# Patient Record
Sex: Male | Born: 1940 | Race: White | Hispanic: No | Marital: Single | Smoking: Current every day smoker
Health system: Southern US, Community
[De-identification: ages and names within clinical notes are randomized; demographics above are authoritative.]

## PROBLEM LIST (undated history)

## (undated) DIAGNOSIS — Z9289 Personal history of other medical treatment: Secondary | ICD-10-CM

## (undated) DIAGNOSIS — E785 Hyperlipidemia, unspecified: Secondary | ICD-10-CM

## (undated) DIAGNOSIS — I255 Ischemic cardiomyopathy: Secondary | ICD-10-CM

## (undated) DIAGNOSIS — I10 Essential (primary) hypertension: Secondary | ICD-10-CM

## (undated) DIAGNOSIS — I2510 Atherosclerotic heart disease of native coronary artery without angina pectoris: Secondary | ICD-10-CM

## (undated) DIAGNOSIS — E119 Type 2 diabetes mellitus without complications: Secondary | ICD-10-CM

## (undated) DIAGNOSIS — Z720 Tobacco use: Secondary | ICD-10-CM

## (undated) HISTORY — DX: Ischemic cardiomyopathy: I25.5

## (undated) HISTORY — PX: BACK SURGERY: SHX140

## (undated) HISTORY — DX: Atherosclerotic heart disease of native coronary artery without angina pectoris: I25.10

## (undated) HISTORY — PX: NECK SURGERY: SHX720

## (undated) HISTORY — DX: Personal history of other medical treatment: Z92.89

## (undated) HISTORY — PX: HERNIA REPAIR: SHX51

## (undated) HISTORY — DX: Tobacco use: Z72.0

## (undated) HISTORY — DX: Hyperlipidemia, unspecified: E78.5

---

## 2013-08-08 ENCOUNTER — Inpatient Hospital Stay (HOSPITAL_COMMUNITY)
Admission: EM | Admit: 2013-08-08 | Discharge: 2013-08-10 | DRG: 247 | Disposition: A | Discharge status: Home / Self Care | Payer: Medicare Other | Attending: Cardiovascular Disease | Admitting: Cardiovascular Disease

## 2013-08-08 ENCOUNTER — Emergency Department (HOSPITAL_COMMUNITY): Payer: Medicare Other

## 2013-08-08 ENCOUNTER — Encounter (HOSPITAL_COMMUNITY): Payer: Self-pay | Admitting: Emergency Medicine

## 2013-08-08 ENCOUNTER — Encounter (HOSPITAL_COMMUNITY)
Admission: EM | Disposition: A | Discharge status: Home / Self Care | Payer: Medicare Other | Source: Home / Self Care | Attending: Cardiovascular Disease

## 2013-08-08 DIAGNOSIS — J449 Chronic obstructive pulmonary disease, unspecified: Secondary | ICD-10-CM

## 2013-08-08 DIAGNOSIS — E7800 Pure hypercholesterolemia, unspecified: Secondary | ICD-10-CM | POA: Diagnosis present

## 2013-08-08 DIAGNOSIS — I517 Cardiomegaly: Secondary | ICD-10-CM

## 2013-08-08 DIAGNOSIS — E785 Hyperlipidemia, unspecified: Secondary | ICD-10-CM

## 2013-08-08 DIAGNOSIS — F17200 Nicotine dependence, unspecified, uncomplicated: Secondary | ICD-10-CM | POA: Diagnosis present

## 2013-08-08 DIAGNOSIS — I2510 Atherosclerotic heart disease of native coronary artery without angina pectoris: Secondary | ICD-10-CM | POA: Diagnosis present

## 2013-08-08 DIAGNOSIS — E119 Type 2 diabetes mellitus without complications: Secondary | ICD-10-CM | POA: Diagnosis present

## 2013-08-08 DIAGNOSIS — J4489 Other specified chronic obstructive pulmonary disease: Secondary | ICD-10-CM | POA: Diagnosis present

## 2013-08-08 DIAGNOSIS — I7389 Other specified peripheral vascular diseases: Secondary | ICD-10-CM | POA: Diagnosis present

## 2013-08-08 DIAGNOSIS — I509 Heart failure, unspecified: Secondary | ICD-10-CM | POA: Diagnosis present

## 2013-08-08 DIAGNOSIS — R059 Cough, unspecified: Secondary | ICD-10-CM | POA: Diagnosis present

## 2013-08-08 DIAGNOSIS — I2589 Other forms of chronic ischemic heart disease: Secondary | ICD-10-CM | POA: Diagnosis present

## 2013-08-08 DIAGNOSIS — I10 Essential (primary) hypertension: Secondary | ICD-10-CM | POA: Diagnosis present

## 2013-08-08 DIAGNOSIS — R05 Cough: Secondary | ICD-10-CM | POA: Diagnosis present

## 2013-08-08 DIAGNOSIS — I2119 ST elevation (STEMI) myocardial infarction involving other coronary artery of inferior wall: Secondary | ICD-10-CM

## 2013-08-08 HISTORY — DX: Essential (primary) hypertension: I10

## 2013-08-08 HISTORY — DX: Type 2 diabetes mellitus without complications: E11.9

## 2013-08-08 HISTORY — PX: PERCUTANEOUS CORONARY STENT INTERVENTION (PCI-S): SHX5485

## 2013-08-08 HISTORY — PX: LEFT HEART CATH: SHX5478

## 2013-08-08 LAB — TSH: TSH: 0.15 u[IU]/mL — AB (ref 0.350–4.500)

## 2013-08-08 LAB — POCT I-STAT TROPONIN I: TROPONIN I, POC: 0.15 ng/mL — AB (ref 0.00–0.08)

## 2013-08-08 LAB — BASIC METABOLIC PANEL
BUN: 16 mg/dL (ref 6–23)
BUN: 17 mg/dL (ref 6–23)
CALCIUM: 10.3 mg/dL (ref 8.4–10.5)
CO2: 25 mEq/L (ref 19–32)
CO2: 22 meq/L (ref 19–32)
Calcium: 9.7 mg/dL (ref 8.4–10.5)
Chloride: 99 mEq/L (ref 96–112)
Chloride: 103 mEq/L (ref 96–112)
Creatinine, Ser: 0.75 mg/dL (ref 0.50–1.35)
Creatinine, Ser: 0.67 mg/dL (ref 0.50–1.35)
GFR calc Af Amer: >90 mL/min (ref >90)
GFR calc non Af Amer: 88 mL/min — ABNORMAL LOW (ref >90)
GFR calc non Af Amer: >90 mL/min (ref >90)
Glucose, Bld: 242 mg/dL — ABNORMAL HIGH (ref 70–99)
Glucose, Bld: 213 mg/dL — ABNORMAL HIGH (ref 70–99)
POTASSIUM: 4.7 meq/L (ref 3.7–5.3)
Potassium: 4.2 mEq/L (ref 3.7–5.3)
SODIUM: 140 meq/L (ref 137–147)
Sodium: 139 mEq/L (ref 137–147)

## 2013-08-08 LAB — CBC
HCT: 44.5 % (ref 39.0–52.0)
HEMATOCRIT: 40.7 % (ref 39.0–52.0)
HEMOGLOBIN: 14.5 g/dL (ref 13.0–17.0)
Hemoglobin: 15.9 g/dL (ref 13.0–17.0)
MCH: 34.3 pg — AB (ref 26.0–34.0)
MCH: 34.2 pg — ABNORMAL HIGH (ref 26.0–34.0)
MCHC: 35.7 g/dL (ref 30.0–36.0)
MCHC: 35.6 g/dL (ref 30.0–36.0)
MCV: 96.1 fL (ref 78.0–100.0)
MCV: 96 fL (ref 78.0–100.0)
PLATELETS: 223 10*3/uL (ref 150–400)
Platelets: 214 10*3/uL (ref 150–400)
RBC: 4.63 MIL/uL (ref 4.22–5.81)
RBC: 4.24 MIL/uL (ref 4.22–5.81)
RDW: 12.5 % (ref 11.5–15.5)
RDW: 12.5 % (ref 11.5–15.5)
WBC: 8.2 10*3/uL (ref 4.0–10.5)
WBC: 7 10*3/uL (ref 4.0–10.5)

## 2013-08-08 LAB — GLUCOSE, CAPILLARY
GLUCOSE-CAPILLARY: 205 mg/dL — AB (ref 70–99)
GLUCOSE-CAPILLARY: 233 mg/dL — AB (ref 70–99)
GLUCOSE-CAPILLARY: 273 mg/dL — AB (ref 70–99)

## 2013-08-08 LAB — POCT ACTIVATED CLOTTING TIME: Activated Clotting Time: 553 seconds

## 2013-08-08 LAB — PROTIME-INR
INR: 1.54 — ABNORMAL HIGH (ref 0.00–1.49)
PROTHROMBIN TIME: 18.1 s — AB (ref 11.6–15.2)

## 2013-08-08 LAB — LIPID PANEL
CHOLESTEROL: 133 mg/dL (ref 0–200)
HDL: 24 mg/dL — AB (ref >39)
LDL CALC: 41 mg/dL (ref 0–99)
TRIGLYCERIDES: 342 mg/dL — AB (ref <150)
Total CHOL/HDL Ratio: 5.5 RATIO
VLDL: 68 mg/dL — ABNORMAL HIGH (ref 0–40)

## 2013-08-08 LAB — MRSA PCR SCREENING: MRSA BY PCR: NEGATIVE

## 2013-08-08 LAB — HEMOGLOBIN A1C
Hgb A1c MFr Bld: 8.2 % — ABNORMAL HIGH (ref <5.7)
Mean Plasma Glucose: 189 mg/dL — ABNORMAL HIGH (ref <117)

## 2013-08-08 LAB — TROPONIN I
TROPONIN I: 11.88 ng/mL — AB (ref <0.30)
TROPONIN I: 19.23 ng/mL — AB (ref <0.30)

## 2013-08-08 LAB — PRO B NATRIURETIC PEPTIDE: Pro B Natriuretic peptide (BNP): 420.4 pg/mL — ABNORMAL HIGH (ref 0–125)

## 2013-08-08 SURGERY — LEFT HEART CATH
Anesthesia: LOCAL

## 2013-08-08 MED ORDER — FENTANYL CITRATE 0.05 MG/ML IJ SOLN
INTRAMUSCULAR | Status: AC
  Filled 2013-08-08: qty 2

## 2013-08-08 MED ORDER — HEPARIN SODIUM (PORCINE) 5000 UNIT/ML IJ SOLN
INTRAMUSCULAR | Status: AC
  Filled 2013-08-08: qty 1

## 2013-08-08 MED ORDER — HEPARIN BOLUS VIA INFUSION
4000.0000 [IU] | Freq: Once | INTRAVENOUS | Status: DC
  Filled 2013-08-08: qty 4000

## 2013-08-08 MED ORDER — NITROGLYCERIN 0.4 MG SL SUBL: 0.4000 mg | SUBLINGUAL_TABLET | SUBLINGUAL | Status: DC | PRN

## 2013-08-08 MED ORDER — NITROGLYCERIN 0.2 MG/ML ON CALL CATH LAB
INTRAVENOUS | Status: AC
  Filled 2013-08-08: qty 1

## 2013-08-08 MED ORDER — ONDANSETRON HCL 4 MG/2ML IJ SOLN: 4.0000 mg | Freq: Four times a day (QID) | INTRAMUSCULAR | Status: DC | PRN

## 2013-08-08 MED ORDER — BIVALIRUDIN 250 MG IV SOLR
INTRAVENOUS | Status: AC
  Filled 2013-08-08: qty 250

## 2013-08-08 MED ORDER — VERAPAMIL HCL 2.5 MG/ML IV SOLN
INTRAVENOUS | Status: AC
  Filled 2013-08-08: qty 2

## 2013-08-08 MED ORDER — ATORVASTATIN CALCIUM 80 MG PO TABS
80.0000 mg | ORAL_TABLET | Freq: Every day | ORAL | Status: DC
  Administered 2013-08-08 – 2013-08-09 (×2): 80 mg via ORAL
  Filled 2013-08-08 (×3): qty 1

## 2013-08-08 MED ORDER — SODIUM CHLORIDE 0.9 % IV SOLN
INTRAVENOUS | Status: AC
  Administered 2013-08-08: 05:00:00 via INTRAVENOUS

## 2013-08-08 MED ORDER — SODIUM CHLORIDE 0.9 % IV SOLN: 0.2500 mg/kg/h | INTRAVENOUS | Status: AC

## 2013-08-08 MED ORDER — METOPROLOL TARTRATE 12.5 MG HALF TABLET
12.5000 mg | ORAL_TABLET | Freq: Two times a day (BID) | ORAL | Status: DC
  Administered 2013-08-08 – 2013-08-10 (×4): 12.5 mg via ORAL
  Filled 2013-08-08 (×6): qty 1

## 2013-08-08 MED ORDER — HEPARIN SODIUM (PORCINE) 5000 UNIT/ML IJ SOLN
4000.0000 [IU] | Freq: Once | INTRAMUSCULAR | Status: AC
  Administered 2013-08-08: 4000 [IU] via INTRAVENOUS

## 2013-08-08 MED ORDER — ACETAMINOPHEN 325 MG PO TABS: 650.0000 mg | ORAL_TABLET | ORAL | Status: DC | PRN

## 2013-08-08 MED ORDER — MIDAZOLAM HCL 2 MG/2ML IJ SOLN
INTRAMUSCULAR | Status: AC
Start: 2013-08-08 — End: 2013-08-08
  Filled 2013-08-08: qty 2

## 2013-08-08 MED ORDER — SODIUM CHLORIDE 0.9 % IV BOLUS (SEPSIS)
1000.0000 mL | Freq: Once | INTRAVENOUS | Status: AC
  Administered 2013-08-08: 1000 mL via INTRAVENOUS

## 2013-08-08 MED ORDER — INSULIN ASPART 100 UNIT/ML ~~LOC~~ SOLN
0.0000 [IU] | Freq: Three times a day (TID) | SUBCUTANEOUS | Status: DC
  Administered 2013-08-08: 3 [IU] via SUBCUTANEOUS
  Administered 2013-08-08: 2 [IU] via SUBCUTANEOUS
  Administered 2013-08-08: 3 [IU] via SUBCUTANEOUS
  Administered 2013-08-09: 2 [IU] via SUBCUTANEOUS
  Administered 2013-08-09: 5 [IU] via SUBCUTANEOUS
  Administered 2013-08-09: 2 [IU] via SUBCUTANEOUS
  Administered 2013-08-10: 3 [IU] via SUBCUTANEOUS
  Administered 2013-08-10: 2 [IU] via SUBCUTANEOUS

## 2013-08-08 MED ORDER — TICAGRELOR 90 MG PO TABS
90.0000 mg | ORAL_TABLET | Freq: Two times a day (BID) | ORAL | Status: DC
  Administered 2013-08-08 – 2013-08-10 (×4): 90 mg via ORAL
  Filled 2013-08-08 (×5): qty 1

## 2013-08-08 MED ORDER — ASPIRIN EC 81 MG PO TBEC
81.0000 mg | DELAYED_RELEASE_TABLET | Freq: Every day | ORAL | Status: DC
  Administered 2013-08-09 – 2013-08-10 (×2): 81 mg via ORAL
  Filled 2013-08-08 (×2): qty 1

## 2013-08-08 MED ORDER — HEPARIN (PORCINE) IN NACL 2-0.9 UNIT/ML-% IJ SOLN
INTRAMUSCULAR | Status: AC
Start: 2013-08-08 — End: 2013-08-08
  Filled 2013-08-08: qty 1000

## 2013-08-08 MED ORDER — HEPARIN (PORCINE) IN NACL 100-0.45 UNIT/ML-% IJ SOLN
1000.0000 [IU]/h | Freq: Once | INTRAMUSCULAR | Status: DC
  Filled 2013-08-08: qty 250

## 2013-08-08 MED ORDER — TICAGRELOR 90 MG PO TABS
ORAL_TABLET | ORAL | Status: AC
  Filled 2013-08-08: qty 2

## 2013-08-08 MED ORDER — LIDOCAINE HCL (PF) 1 % IJ SOLN
INTRAMUSCULAR | Status: AC
  Filled 2013-08-08: qty 30

## 2013-08-08 NOTE — Progress Notes (Addendum)
Subjective:   [name] [name] is a 73 y.o. male with h/o tobacco abuse, HTN, and DM admitted with inferior STEMI early this am.  S/p DES x2  To midRCA. Trop 12.   No further CP. Coughing a lot. C/o sinus drainage.   Left main: No obstructive disease.  Left Anterior Descending Artery: Large caliber vessel that courses to the apex. Diffuse 30% stenosis in the mid LAD. The first diagonal is small in caliber with 30% ostial stenosis.  Circumflex Artery: Large caliber vessel with 30% mid stenosis, small caliber first obtuse marginal branch and large caliber second obtuse marginal branch. There is 20% stenosis at the ostium of the second obtuse marginal branch. No obstructive disease.  Right Coronary Artery: Moderate caliber dominant vessel with mid vessel calcification, 99% sub-total occlusion in the mid vessel. The distal vessel has mild plaque disease. The PDA is small in caliber and has mild diffuse disease.  Left Ventricular Angiogram: LVEF=45%, hypokinesis of the inferior wall at the apex.      Intake/Output Summary (Last 24 hours) at 08/08/13 0828 Last data filed at 08/08/13 0600  Gross per 24 hour  Intake    142 ml  Output    300 ml  Net   -158 ml    Current meds: . [START ON 08/09/2013] aspirin EC  81 mg Oral Daily  . atorvastatin  80 mg Oral q1800  . insulin aspart  0-9 Units Subcutaneous TID WC  . metoprolol tartrate  12.5 mg Oral BID  . Ticagrelor  90 mg Oral BID   Infusions: . sodium chloride 75 mL/hr at 08/08/13 0445     Objective:  Blood pressure 130/72, pulse 84, temperature 97.8 F (36.6 C), temperature source Oral, resp. rate 19, height 5\' 6"  (1.676 m), weight 74.9 kg (165 lb 2 oz), SpO2 98.00%. Weight change:   Physical Exam: General:  Elderly. Coughing fit HEENT: normal Neck: supple. JVP flat . Carotids 2+ bilat; no bruits. No lymphadenopathy or thryomegaly appreciated. Cor: PMI nonpalpable. Distant HS Regular rate & rhythm. No obviousrubs, gallops or  murmurs. Lungs: markedly diminished BS throughout Abdomen: soft, nontender, nondistended. No hepatosplenomegaly. No bruits or masses. Good bowel sounds. Extremities: no cyanosis, clubbing, rash, edema DP pulses nonpalpbale Neuro: alert & orientedx3, cranial nerves grossly intact. moves all 4 extremities w/o difficulty. Affect pleasant  Telemetry: SR  Lab Results: Basic Metabolic Panel:  Recent Labs Lab 08/08/13 0220 08/08/13 0550  NA 140 139  K 4.2 4.7  CL 99 103  CO2 25 22  GLUCOSE 242* 213*  BUN 16 17  CREATININE 0.75 0.67  CALCIUM 10.3 9.7   Liver Function Tests: No results found for this basename: AST, ALT, ALKPHOS, BILITOT, PROT, ALBUMIN,  in the last 168 hours No results found for this basename: LIPASE, AMYLASE,  in the last 168 hours No results found for this basename: AMMONIA,  in the last 168 hours CBC:  Recent Labs Lab 08/08/13 0220 08/08/13 0550  WBC 8.2 7.0  HGB 15.9 14.5  HCT 44.5 40.7  MCV 96.1 96.0  PLT 223 214   Cardiac Enzymes:  Recent Labs Lab 08/08/13 0550  TROPONINI 11.88*   BNP: No components found with this basename: POCBNP,  CBG: No results found for this basename: GLUCAP,  in the last 168 hours Microbiology: No results found for this basename: cult   No results found for this basename: CULT, SDES,  in the last 168 hours  Imaging: Dg Chest Portable 1 View  08/08/2013  CLINICAL DATA:  Chest pain.  EXAM: PORTABLE CHEST - 1 VIEW  COMPARISON:  None.  FINDINGS: The lungs are well-aerated and clear. There is no evidence of focal opacification, pleural effusion or pneumothorax.  The cardiomediastinal silhouette is within normal limits. No acute osseous abnormalities are seen.  IMPRESSION: No acute cardiopulmonary process seen.   Electronically Signed   By: Roanna Raider M.D.   On: 08/08/2013 02:38     ASSESSMENT:  1. Inferior STEMI - s/p DES x2 mid RCA 08/08/13 2. CAD  3. Ischemic CM 4. COPD with ongoing Tobacco abuse (2PPD)  5.  DM 6. HTN   PLAN/DISCUSSION:  He is stable post-MI. May be able to transfer out of CCU later today. Appears to have severe COPD and PAD on exam. Long talk about need to quit smoking but he was resistant even to ecigs - has tried to quit multiple times and failled. May need nicotine patch to prevent withdrawal.   Will continue ASA, Brillinta, statin. Switch b-blocker to bisoprolol. Echo pending.   BP well controlled.   Cover DM2 with SSI whil metformin on hold. Resume ACE.    LOS: 0 days    Arvilla Meres, MD 08/08/2013, 8:28 AM

## 2013-08-08 NOTE — ED Notes (Signed)
Dr. Lavella LemonsManly notified of i stat troponin results by B. Bing PlumeHaynes, EMT

## 2013-08-08 NOTE — Progress Notes (Signed)
CARDIAC REHAB PHASE I  Pt with very recent MI, will allow to get to recliner and up in room this evening, will ambulate tomorrow. Discussed MI/stent with pt and family. Broached smoking cessation although pt is not very interested. Also gave diet sheet for pt to read. Pt sts he does try to watch his diet somewhat. Risa GrillJokingly sts he is not interested in exercise due to arthritis. Sts he takes multiple Bayer aspirins daily for arthritic pain. Will f/u in am for more education and ambulation. [id number]  Elissa LovettReeve, Shamyah Stantz Porters NeckKristan CES, ACSM 08/08/2013 1:59 PM

## 2013-08-08 NOTE — H&P (Signed)
Cardiology History and Physical  No primary provider on file.  History of Present Illness (and review of medical records): [name] [name] is a 73 y.o. male who presents for evaluation of chest pain.  He has no hx of MI or known CAD.  He has hx of tobacco abuse, HTN, and DM.  He developed chest pain initially Wed night.  Pain was left sided and rated 4/10 at its worse.  Pain was intermittent over the past two days but came back severe tonight.  He had shortness of breath and nausea.  He took no meds at home.  Family called EMS.  He was given ASA and nitro.  Pain improved but still present.  His ecg was concerning of inferior ST elevation and CODE STEMI was called.    Previous diagnostic testing for coronary artery disease includes: Reports prior stress testing for preoperative evaluation in 2013, no results at this time.. Previous history of cardiac disease includes None. Coronary artery disease risk factors include: advanced age (older than 67 for men, 79 for women), diabetes mellitus, hypertension, male gender and smoking/ tobacco exposure. Patient denies history of ischemic heart disease, previous M.I. and valvular disease.  Review of Systems Pertinent items are noted in HPI. Further review of systems was otherwise negative other than stated in HPI.  There are no active problems to display for this patient.  Past Medical History  Diagnosis Date  . Hypertension   . Diabetes mellitus without complication     Past Surgical History  Procedure Laterality Date  . Neck surgery    . Back surgery    . Hernia repair      Prescriptions prior to admission  Medication Sig Dispense Refill  . lisinopril (PRINIVIL,ZESTRIL) 10 MG tablet Take 10 mg by mouth daily.       . metFORMIN (GLUCOPHAGE) 500 MG tablet Take 500-1,000 mg by mouth 2 (two) times daily with a meal.       . tamsulosin (FLOMAX) 0.4 MG CAPS capsule Take 0.4 mg by mouth daily.        No Known Allergies  History  Substance Use  Topics  . Smoking status: Current Every Day Smoker -- 2.00 packs/day    Types: Cigarettes  . Smokeless tobacco: Not on file  . Alcohol Use: Yes     Comment: occasionally    History reviewed. No pertinent family history.   Objective:  Patient Vitals for the past 8 hrs:  BP Temp Temp src Pulse Resp SpO2  08/08/13 0245 132/90 mmHg - - 98 22 98 %  08/08/13 0230 121/67 mmHg - - 97 25 98 %  08/08/13 0200 118/61 mmHg - - 95 22 96 %  08/08/13 0155 123/73 mmHg 97.8 F (36.6 C) Oral 100 20 97 %   General appearance: alert, cooperative, elderly appearing male in mild distress Head: Normocephalic, without obvious abnormality, atraumatic Eyes: conjunctivae/corneas clear. PERRL, EOM's intact. Fundi benign. Neck: no carotid bruit, no JVD and supple, symmetrical, trachea midline Lungs: clear to auscultation bilaterally Chest wall: no tenderness Heart: regular rate and rhythm, S1, S2 normal,  Abdomen: soft, non-tender; bowel sounds normal; no masses,  Extremities: extremities normal, atraumatic, LE edema Pulses: 2+ and symmetric Neurologic: Grossly normal  Results for orders placed during the hospital encounter of 08/08/13 (from the past 48 hour(s))  CBC     Status: Abnormal   Collection Time    08/08/13  2:20 AM      Result Value Range   WBC 8.2  4.0 - 10.5 K/uL   RBC 4.63  4.22 - 5.81 MIL/uL   Hemoglobin 15.9  13.0 - 17.0 g/dL   HCT 44.5  39.0 - 52.0 %   MCV 96.1  78.0 - 100.0 fL   MCH 34.3 (*) 26.0 - 34.0 pg   MCHC 35.7  30.0 - 36.0 g/dL   RDW 12.5  11.5 - 15.5 %   Platelets 223  150 - 400 K/uL  BASIC METABOLIC PANEL     Status: Abnormal   Collection Time    08/08/13  2:20 AM      Result Value Range   Sodium 140  137 - 147 mEq/L   Potassium 4.2  3.7 - 5.3 mEq/L   Chloride 99  96 - 112 mEq/L   CO2 25  19 - 32 mEq/L   Glucose, Bld 242 (*) 70 - 99 mg/dL   BUN 16  6 - 23 mg/dL   Creatinine, Ser 0.75  0.50 - 1.35 mg/dL   Calcium 10.3  8.4 - 10.5 mg/dL   GFR calc non Af Amer 88  (*) >90 mL/min   GFR calc Af Amer >90  >90 mL/min   Comment: (NOTE)     The eGFR has been calculated using the CKD EPI equation.     This calculation has not been validated in all clinical situations.     eGFR's persistently <90 mL/min signify possible Chronic Kidney     Disease.  PRO B NATRIURETIC PEPTIDE     Status: Abnormal   Collection Time    08/08/13  2:20 AM      Result Value Range   Pro B Natriuretic peptide (BNP) 420.4 (*) 0 - 125 pg/mL  POCT I-STAT TROPONIN I     Status: Abnormal   Collection Time    08/08/13  2:24 AM      Result Value Range   Troponin i, poc 0.15 (*) 0.00 - 0.08 ng/mL   Comment NOTIFIED PHYSICIAN     Comment 3            Comment: Due to the release kinetics of cTnI,     a negative result within the first hours     of the onset of symptoms does not rule out     myocardial infarction with certainty.     If myocardial infarction is still suspected,     repeat the test at appropriate intervals.   Dg Chest Portable 1 View  08/08/2013   CLINICAL DATA:  Chest pain.  EXAM: PORTABLE CHEST - 1 VIEW  COMPARISON:  None.  FINDINGS: The lungs are well-aerated and clear. There is no evidence of focal opacification, pleural effusion or pneumothorax.  The cardiomediastinal silhouette is within normal limits. No acute osseous abnormalities are seen.  IMPRESSION: No acute cardiopulmonary process seen.   Electronically Signed   By: Garald Balding M.D.   On: 08/08/2013 02:38    ECG:  Sinus brady HR 56 ST elevation 2,3, avf, ST depression anterior leads, nonspecific intraventricular conduction delay QRS 150, no prior ecgs to compare.   Assessment: Inferior STEMI Mild CHF HTN DM Tobacco abuse  Plan: 1. Emergent LHC possible PCI. Consent obtained 2. IV heparin bolus given in ED. 3. No contraindications for DAPT. 4. Admit to CCU post procedure. 5. Check lipids, hgba1c, tsh 6. Medical management to include ASA, BB, ACEi, Statin, NTG prn 7. Hold metformin,  SSI 8. Tobacco cessation.

## 2013-08-08 NOTE — Progress Notes (Signed)
  Echocardiogram 2D Echocardiogram has been performed.  Georgian CoWILLIAMS, Dashay Giesler 08/08/2013, 10:05 AM

## 2013-08-08 NOTE — ED Notes (Signed)
Experiencing difficulty getting clear picture of EKG rhythm.  Switched pt leads multiple times; pt alert, denies pain, shaky which he states is normal for him.  Attempted to obtain another 12 lead EKG, experienced great difficulty trying to analyze due to baseline going up and down on monitor.  Finally able to see 12 lead more clearly at which time one could see the ST elevation.

## 2013-08-08 NOTE — ED Notes (Signed)
To Cath lab 

## 2013-08-08 NOTE — CV Procedure (Signed)
Cardiac Catheterization Operative Report  [name] [name] [id number] 1/23/20154:02 AM No primary provider on file.  Procedure Performed:  1. Left Heart Catheterization 2. Selective Coronary Angiography 3. Left ventricular angiogram 4. PTCA/DES x 2 mid RCA  Operator: Verne Carrow, MD  Arterial access site:  Right radial artery.   Indication:  73 yo male with history HTN, DM, tobacco abuse (110 pack years) admitted with inferior STEMI.   First EKG: 1:52 am Code STEMI called: 2:20am Arrival to Cath Lab: 3:00am Access: 3:08 am First Balloon: 3:21am                                     Procedure Details: The risks, benefits, complications, treatment options, and expected outcomes were discussed with the patient. The patient and/or family concurred with the proposed plan. Emergency consent obtained. The patient was brought to the cath lab from the ED. The patient was further sedated with Versed. The right wrist was assessed with an Allens test which was positive. The right wrist was prepped and draped in a sterile fashion. 1% lidocaine was used for local anesthesia. Using the modified Seldinger access technique, a 5/6 French sheath was placed in the right radial artery. 3 mg Verapamil was given through the sheath. A weight based bolus of Angiomax was given and a drip was started. Standard diagnostic catheters were used to perform selective coronary angiography. The patient was found to have a sub-total occlusion of the mid RCA with TIMI-2 flow down into the distal RCA. I elected to proceed to emergent angioplasty.   PCI Note: The RCA was engaged with a JR4 guiding catheter. He was given 180 mg Brilinta po x 1. When the ACT was confirmed to be over 200, I passed a Cougar IC wire down the RCA. The lesion was pre-dilated with a 2.0 x 15 mm balloon x 1. I then deployed a 2.75 x 24 mm Promus Premier DES in the mid RCA. There was a short segment of disease just beyond the stent that  was not covered. I then placed a 2.5 x 12 mm Promus Premier DES in an overlapping fashion on the distal edge of the first stent. Of note, there was considerable difficulty delivering stents due to calcification of the vessel and proximal tortuosity. The distal stent was post-dilated with a 2.5 x 12 mm [place] balloon x 2. The more proximal stent was post-dilated with a 2.75 x 12 mm [place] balloon x 2. There was an excellent angiographic result. The stenosis was taken from 99% down to 0%.  A pigtail catheter was used to perform a left ventricular angiogram. The sheath was removed from the right radial artery and a Terumo hemostasis band was applied at the arteriotomy site on the right wrist.    There were no immediate complications. The patient was taken to the recovery area in stable condition.   Hemodynamic Findings: Central aortic pressure: 99/55 Left ventricular pressure: 99/5/13  Angiographic Findings:  Left main: No obstructive disease.   Left Anterior Descending Artery:  Large caliber vessel that courses to the apex. Diffuse 30% stenosis in the mid LAD. The first diagonal is small in caliber with 30% ostial stenosis.   Circumflex Artery: Large caliber vessel with 30% mid stenosis, small caliber first obtuse marginal branch and large caliber second obtuse marginal branch. There is 20% stenosis at the ostium of the second obtuse marginal branch.  No obstructive disease.   Right Coronary Artery: Moderate caliber dominant vessel with mid vessel calcification, 99% sub-total occlusion in the mid vessel. The distal vessel has mild plaque disease. The PDA is small in caliber and has mild diffuse disease.   Left Ventricular Angiogram: LVEF=45%, hypokinesis of the inferior wall at the apex.   Impression: 1. Acute inferior STEMI secondary to sub-totally occluded mid RCA 2. Successful PTCA/DES x 2 mid RCA 3. Mild disease in LAD and Circumflex 4. Mild segmental LV systolic dysfunction  Recommendations: He  will be admitted to the CCU. He will need continued dual anti-platelet therapy with ASA and Brilinta for at least one year. Will start statin and beta blocker. Resume Ace-inh later today if BP tolerates. Echo later today. SSI coverage. Monitor CCU at least 12 hours. If stable could move out to telemetry unit in afternoon.        Complications:  None. The patient tolerated the procedure well.

## 2013-08-08 NOTE — ED Provider Notes (Signed)
CSN: [id number]     Arrival date & time 08/08/13  0146 History   First MD Initiated Contact with Patient 08/08/13 [phone]     Chief Complaint  Patient presents with  . Code STEMI   (Consider location/radiation/quality/duration/timing/severity/associated sxs/prior Treatment) HPI This patient is a 73 year old diabetic man with hypertension and hypercholesterolemia he smokes 2 packs of cigarettes per day and has 110-pack-year smoking history.  He is brought to the emergency department by EMS with complaints of centrally located chest pain. He has had pain intermittently over the past 3 days. His pain actually resolved prior to arrival to the emergency department and the patient is currently chest pain-free. He feels like his symptoms have worsened over the past couple of days. He has no associated shortness of breath, diaphoresis, lightheadedness. He is a chronic cough.  The patient denies history of similar symptoms. He denies any personal or family history of coronary artery disease and says that he has never had any form of cardiac evaluation, to the best of his knowledge.  At its worst, the patient's pain was 7/10. It was nonradiating. He denies appreciated any exacerbating or relieving factors. He was treated with aspirin on route.  Past Medical History  Diagnosis Date  . Hypertension   . Diabetes mellitus without complication    Past Surgical History  Procedure Laterality Date  . Neck surgery    . Back surgery    . Hernia repair     History reviewed. No pertinent family history. History  Substance Use Topics  . Smoking status: Current Every Day Smoker -- 2.00 packs/day    Types: Cigarettes  . Smokeless tobacco: Not on file  . Alcohol Use: Yes     Comment: occasionally    Review of Systems Ten point review of symptoms performed and is negative with the exception of symptoms noted above.  Patient has a chronic productive cough Allergies  Review of patient's allergies indicates  no known allergies.  Home Medications  No current outpatient prescriptions on file. BP 123/73  Pulse 100  Temp(Src) 97.8 F (36.6 C) (Oral)  Resp 20  SpO2 97% Physical Exam Gen: well developed and well nourished appearing Head: NCAT Eyes: PERL, EOMI Nose: no epistaixis or rhinorrhea Mouth/throat: mucosa is moist and pink Neck: supple, no stridor Lungs: CTA B, no wheezing, rhonchi or rales CV: RRR, no murmur, extremities appear well perfused.  Abd: soft, notender, nondistended Back: no ttp, no cva ttp Skin: warm and dry Ext: normal to inspection, no dependent edema Neuro: CN ii-xii grossly intact, no focal deficits Psyche; normal affect,  calm and cooperative.   ED Course  Procedures (including critical care time) Labs Review  Results for orders placed during the hospital encounter of 08/08/13 (from the past 24 hour(s))  CBC     Status: Abnormal   Collection Time    08/08/13  2:20 AM      Result Value Range   WBC 8.2  4.0 - 10.5 K/uL   RBC 4.63  4.22 - 5.81 MIL/uL   Hemoglobin 15.9  13.0 - 17.0 g/dL   HCT 56.2  13.0 - 86.5 %   MCV 96.1  78.0 - 100.0 fL   MCH 34.3 (*) 26.0 - 34.0 pg   MCHC 35.7  30.0 - 36.0 g/dL   RDW 78.4  69.6 - 29.5 %   Platelets 223  150 - 400 K/uL  POCT I-STAT TROPONIN I     Status: Abnormal   Collection Time  08/08/13  2:24 AM      Result Value Range   Troponin i, poc 0.15 (*) 0.00 - 0.08 ng/mL   Comment NOTIFIED PHYSICIAN     Comment 3              Imaging Review Dg Chest Portable 1 View  08/08/2013   CLINICAL DATA:  Chest pain.  EXAM: PORTABLE CHEST - 1 VIEW  COMPARISON:  None.  FINDINGS: The lungs are well-aerated and clear. There is no evidence of focal opacification, pleural effusion or pneumothorax.  The cardiomediastinal silhouette is within normal limits. No acute osseous abnormalities are seen.  IMPRESSION: No acute cardiopulmonary process seen.   Electronically Signed   By: Roanna RaiderJeffery  Chang M.D.   On: 08/08/2013 02:38    EKG:  NSR, wide qrs, ST elevation in the inferior leads with recipricol changes, normal intervals.   MDM   1. ST elevation myocardial infarction (STEMI) of inferolateral wall, initial episode of care    Code STEMI initiated. Patient remains pain free. Had ASA PTA. We are loading with heparin. Case discussed with Cardiologist on call who will activate the Cath Lab Team. We are withholding NTG and BB in light of risk of hypotension with inferior MI. We are treating with NS bolus. Hemodynamics are wnl.     Brandt LoosenJulie Manly, MD 08/08/13 [phone]

## 2013-08-08 NOTE — ED Provider Notes (Addendum)
EKG reviewed at 02014. It had been placed on my computer keyboard. EKG performed at 0209 shows ST elevation in the inferior leads with recipricol changes. CODE STEMI initiated after brief discussion and exam of patient.   Brandt LoosenJulie Manly, MD 08/08/13 0221  Brandt LoosenJulie Manly, MD 08/08/13 [phone]

## 2013-08-08 NOTE — ED Notes (Signed)
Family at bedside. 

## 2013-08-08 NOTE — ED Notes (Addendum)
Pt states chest pain began 2 days ago, worse when laying down.  Sitting up improves.  Difficulty getting history.  Had ntg and asa 324mg  en route.

## 2013-08-08 NOTE — Care Management Note (Signed)
    Page 1 of 1   08/08/2013     10:47:53 AM   CARE MANAGEMENT NOTE 08/08/2013  Patient:  [name] [name],[name] [name]   Account Number:  [id number]  Date Initiated:  08/08/2013  Documentation initiated by:  Junius CreamerWELL,DEBBIE  Subjective/Objective Assessment:   adm w mi     Action/Plan:   lives w fam   Anticipated DC Date:     Anticipated DC Plan:        DC Planning Services  CM consult  Medication Assistance      Choice offered to / List presented to:             Status of service:   Medicare Important Message given?   (If response is "NO", the following Medicare IM given date fields will be blank) Date Medicare IM given:   Date Additional Medicare IM given:    Discharge Disposition:    Per UR Regulation:  Reviewed for med. necessity/level of care/duration of stay  If discussed at Long Length of Stay Meetings, dates discussed:    Comments:  1/23 1046a debbie Lucella Pommier rn,bsn pt was left brilinta 30day free card.

## 2013-08-08 NOTE — Progress Notes (Signed)
Inpatient Diabetes Program Recommendations  AACE/ADA: New Consensus Statement on Inpatient Glycemic Control (2013)  Target Ranges:  Prepandial:   less than 140 mg/dL      Peak postprandial:   less than 180 mg/dL (1-2 hours)      Critically ill patients:  140 - 180 mg/dL  Results for [name] [name], [name] [name] (MRN [id number]) as of 08/08/2013 10:05  Ref. Range 08/08/2013 02:20 08/08/2013 02:24 08/08/2013 05:50  Glucose Latest Range: 70-99 mg/dL 119242 (H)  147213 (H)   Inpatient Diabetes Program Recommendations Insulin - Basal: consider adding Lantus 10 units  HgbA1C: pending Diet: add carbohydrate modified to current heart healthy diet  Thank you  Piedad ClimesGina Roseland Braun BSN, RN,CDE Inpatient Diabetes Coordinator [phone] (team pager)

## 2013-08-08 NOTE — ED Notes (Signed)
Cardiology at bedside.

## 2013-08-09 DIAGNOSIS — J449 Chronic obstructive pulmonary disease, unspecified: Secondary | ICD-10-CM

## 2013-08-09 DIAGNOSIS — E785 Hyperlipidemia, unspecified: Secondary | ICD-10-CM

## 2013-08-09 DIAGNOSIS — I2119 ST elevation (STEMI) myocardial infarction involving other coronary artery of inferior wall: Secondary | ICD-10-CM

## 2013-08-09 LAB — BASIC METABOLIC PANEL
BUN: 14 mg/dL (ref 6–23)
CALCIUM: 9.5 mg/dL (ref 8.4–10.5)
CHLORIDE: 103 meq/L (ref 96–112)
CO2: 23 mEq/L (ref 19–32)
Creatinine, Ser: 0.57 mg/dL (ref 0.50–1.35)
GFR calc non Af Amer: >90 mL/min (ref >90)
Glucose, Bld: 265 mg/dL — ABNORMAL HIGH (ref 70–99)
Potassium: 4.2 mEq/L (ref 3.7–5.3)
Sodium: 140 mEq/L (ref 137–147)

## 2013-08-09 LAB — TROPONIN I: TROPONIN I: 5.44 ng/mL — AB (ref <0.30)

## 2013-08-09 LAB — GLUCOSE, CAPILLARY
GLUCOSE-CAPILLARY: 198 mg/dL — AB (ref 70–99)
GLUCOSE-CAPILLARY: 263 mg/dL — AB (ref 70–99)
Glucose-Capillary: 186 mg/dL — ABNORMAL HIGH (ref 70–99)
Glucose-Capillary: 178 mg/dL — ABNORMAL HIGH (ref 70–99)

## 2013-08-09 MED ORDER — LOSARTAN POTASSIUM 25 MG PO TABS
25.0000 mg | ORAL_TABLET | Freq: Every day | ORAL | Status: DC
  Administered 2013-08-09 – 2013-08-10 (×2): 25 mg via ORAL
  Filled 2013-08-09 (×2): qty 1

## 2013-08-09 NOTE — Progress Notes (Signed)
CARDIAC REHAB PHASE I   PRE:  Rate/Rhythm: 85SR  BP:  Supine:   Sitting: 131/58  Standing:    SaO2: 97%RA  MODE:  Ambulation: 500 ft   POST:  Rate/Rhythm: 95SR  BP:  Supine:   Sitting: 139/57  Standing:    SaO2: 92-96%RA 0959-1045 Pt walked 500 ft with rolling walker with steady gait. Arthritis started to bother him at end of walk. No CP. To bed after walk as he said he did not sleep well last night. Reviewed NTG use, ex ed , importance of brilinta, and encouraged not smoking. Brief review of heart healthy diet. Pt listened and was very receptive but not sure he will make changes needed. Stated doing the best he can with diet and trying to watch carbs. [facility] but then decided against it so will not refer. Does not like to leave his home much he stated.    Luetta Nuttingharlene Marney Treloar, RN BSN  08/09/2013 10:41 AM

## 2013-08-09 NOTE — Progress Notes (Signed)
Subjective: Deneis CP  No SOB Objective: Filed Vitals:   08/09/13 0300 08/09/13 0400 08/09/13 0500 08/09/13 0600  BP: 136/79 122/73 128/83 151/102  Pulse: 57 63 64 86  Temp:      TempSrc:      Resp: 13 23 26 16   Height:      Weight:      SpO2: 96% 95% 96% 96%   Weight change:   Intake/Output Summary (Last 24 hours) at 08/09/13 16100722 Last data filed at 08/09/13 0000  Gross per 24 hour  Intake    315 ml  Output   1000 ml  Net   -685 ml    General: Alert, awake, oriented x3, in no acute distress Neck:  JVP is normal Heart: Regular rate and rhythm, without murmurs, rubs, gallops.  Lungs: Diffuse rhonchi Exemities:  No edema.   Neuro: Grossly intact, nonfocal.  Tel:  SR Lab Results: Results for orders placed during the hospital encounter of 08/08/13 (from the past 24 hour(s))  TROPONIN I     Status: Abnormal   Collection Time    08/08/13 10:50 AM      Result Value Range   Troponin I 19.23 (*) <0.30 ng/mL  GLUCOSE, CAPILLARY     Status: Abnormal   Collection Time    08/08/13 11:58 AM      Result Value Range   Glucose-Capillary 205 (*) 70 - 99 mg/dL  GLUCOSE, CAPILLARY     Status: Abnormal   Collection Time    08/08/13  5:18 PM      Result Value Range   Glucose-Capillary 233 (*) 70 - 99 mg/dL  GLUCOSE, CAPILLARY     Status: Abnormal   Collection Time    08/08/13  9:45 PM      Result Value Range   Glucose-Capillary 273 (*) 70 - 99 mg/dL    Studies/Results: @RISRSLT24 @  Medications: Reviewed   @PROBHOSP @  1.  Inferior STEMI  S/p DES x 2 to mid RCA on 1/23  No symptoms  Tx to floor  AMbulate  Possible d/c late tomorrow or early Mon  Repeat troponin.    2.  CAD As above  3.  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  Follob BP  Continue b blocker.   4.  COPD  Significant.  Counselled on quitting  Patinet says he has tried, ended up smoking more.    5.  DM   6.  HTN  Follow  LOS: 1 day   Dietrich PatesPaula  Ross 08/09/2013, 7:22 AM

## 2013-08-10 DIAGNOSIS — F17200 Nicotine dependence, unspecified, uncomplicated: Secondary | ICD-10-CM | POA: Diagnosis present

## 2013-08-10 DIAGNOSIS — I10 Essential (primary) hypertension: Secondary | ICD-10-CM | POA: Diagnosis present

## 2013-08-10 DIAGNOSIS — E785 Hyperlipidemia, unspecified: Secondary | ICD-10-CM | POA: Diagnosis present

## 2013-08-10 DIAGNOSIS — E119 Type 2 diabetes mellitus without complications: Secondary | ICD-10-CM | POA: Diagnosis present

## 2013-08-10 DIAGNOSIS — I2510 Atherosclerotic heart disease of native coronary artery without angina pectoris: Secondary | ICD-10-CM | POA: Diagnosis present

## 2013-08-10 DIAGNOSIS — J449 Chronic obstructive pulmonary disease, unspecified: Secondary | ICD-10-CM | POA: Diagnosis present

## 2013-08-10 LAB — GLUCOSE, CAPILLARY
GLUCOSE-CAPILLARY: 208 mg/dL — AB (ref 70–99)
Glucose-Capillary: 169 mg/dL — ABNORMAL HIGH (ref 70–99)

## 2013-08-10 MED ORDER — NITROGLYCERIN 0.4 MG SL SUBL: 0.4000 mg | SUBLINGUAL_TABLET | SUBLINGUAL | Status: DC | PRN

## 2013-08-10 MED ORDER — LOSARTAN POTASSIUM 25 MG PO TABS: 25.0000 mg | ORAL_TABLET | Freq: Every day | ORAL | Status: DC

## 2013-08-10 MED ORDER — TICAGRELOR 90 MG PO TABS: 90.0000 mg | ORAL_TABLET | Freq: Two times a day (BID) | ORAL | Status: DC

## 2013-08-10 MED ORDER — BISOPROLOL FUMARATE 5 MG PO TABS
5.0000 mg | ORAL_TABLET | Freq: Every day | ORAL | Status: DC
  Administered 2013-08-10: 5 mg via ORAL
  Filled 2013-08-10: qty 1

## 2013-08-10 MED ORDER — BISOPROLOL FUMARATE 5 MG PO TABS: 5.0000 mg | ORAL_TABLET | Freq: Every day | ORAL | Status: DC

## 2013-08-10 MED ORDER — ACETAMINOPHEN 325 MG PO TABS: 650.0000 mg | ORAL_TABLET | ORAL | Status: DC | PRN

## 2013-08-10 MED ORDER — ATORVASTATIN CALCIUM 80 MG PO TABS: 80.0000 mg | ORAL_TABLET | Freq: Every day | ORAL | Status: DC

## 2013-08-10 MED ORDER — ATORVASTATIN CALCIUM 40 MG PO TABS: 40.0000 mg | ORAL_TABLET | Freq: Every day | ORAL | Status: DC

## 2013-08-10 MED ORDER — METFORMIN HCL 500 MG PO TABS: 500.0000 mg | ORAL_TABLET | Freq: Two times a day (BID) | ORAL | Status: AC

## 2013-08-10 MED ORDER — ASPIRIN 81 MG PO TBEC: 81.0000 mg | DELAYED_RELEASE_TABLET | Freq: Every day | ORAL | Status: AC

## 2013-08-10 NOTE — Discharge Summary (Signed)
Personally seen and examined. Agree with above. 35 min spent on DC. Discussion with patient, meds, appt, labs. Please see my progress note for further details if needed.

## 2013-08-10 NOTE — Discharge Instructions (Signed)
Myocardial Infarction °A myocardial infarction (MI) is damage to the heart that is not reversible. It is also called a heart attack. An MI usually occurs when a heart (coronary) artery becomes blocked or narrowed. This cuts off the blood supply to the heart. When one or more of the heart (coronary) arteries becomes blocked, that area of the heart begins to die. This causes pain felt during an MI.  °If you think you might be having an MI, call your local emergency services immediately (911 in [place] It is recommended that you take a 162 mg non-enteric coated aspirin if you do not have an aspirin allergy. Do not drive yourself to the hospital or wait to see if your symptoms go away. The sooner MI is treated, the greater the amount of heart muscle saved. Time is muscle. It can save your life. °CAUSES  °An MI can occur from: °· A gradual buildup of a fatty substance called plaque. When plaque builds up in the arteries, this condition is called atherosclerosis. This buildup can block or reduce the blood supply to the heart artery(s). °· A sudden plaque rupture within a heart artery that causes a blood clot (thrombus). A blood clot can block the heart artery which does not allow blood flow to the heart. °· A severe tightening (spasm) of the heart artery. This is a less common cause of a heart attack. When a heart artery spasms, it cuts off blood flow through the artery. Spasms can occur in heart arteries that do not have atherosclerosis. °RISK FACTORS °People at risk for an MI usually have one or more risk factors, such as: °· High blood pressure. °· High cholesterol. °· Smoking. °· Gender. Men have a higher heart attack risk. °· Overweight/obesity. °· Age. °· Family history. °· Lack of exercise. °· Diabetes. °· Stress. °· Excessive alcohol use. °· Street drug use (cocaine and methamphetamines). °SYMPTOMS  °MI symptoms can vary, such as: °· In both men and women, MI symptoms can include the following: °· Chest pain. The  chest pain may feel like a crushing, squeezing, or "pressure" type feeling. MI pain can be "referred," meaning pain can be caused in one part of the body but felt in another part of the body. Referred MI pain may occur in the left arm, neck, or jaw. Pain may even be felt in the right arm. °· Shortness of breath (dyspnea). °· Heartburn or indigestion with or without vomiting, shortness of breath, or sweating (diaphoresis). °· Sudden, cold sweats. °· Sudden lightheadedness. °· Upper back pain. °· Women can have unique MI symptoms, such as: °· Unexplained feelings of nervousness or anxiety. °· Discomfort between the shoulder blades (scapula) or upper back. °· Tingling in the hands and arms. °· In elderly people (regardless of gender), MI symptoms can be subtle, such as: °· Sweating (diaphoresis). °· Shortness of breath (dyspnea). °· General tiredness (fatigue) or not feeling well (malaise). °DIAGNOSIS  °Diagnosis of an MI involves several tests such as: °· An assessment of your vital signs such as heart rhythm, blood pressure, respiratory rate, and oxygen level. °· An EKG (ECG) to look at the electrical activity of your heart. °· Blood tests called cardiac markers are drawn at scheduled times to measure proteins or enzymes released by the damaged heart muscle. °· A chest X-ray. °· An echocardiogram to evaluate heart motion and blood flow. °· Coronary angiography (cardiac catheterization). This is a diagnostic procedure to look at the heart arteries. °TREATMENT  °Acute Intervention. For an   MI, the national standard in the [place] is to have an acute intervention in under 90 minutes from the time you get to the hospital. An acute intervention is a special procedure to open up the heart arteries. It is done in a treatment room called a "catheterization lab" (cath lab). Some hospitals do no have a cath lab. If you are having an MI and the hospital does not have a cath lab, the standard is to transport you to a  hospital that has one. In the cath lab, acute intervention includes: °· Angioplasty. An angioplasty involves inserting a thin, flexible tube (catheter) into an artery in either your groin or wrist. The catheter is threaded to the heart arteries. A balloon at the end of the catheter is inflated to open a narrowed or blocked heart artery. During an angioplasty procedure, a small mesh tube (stent) may be used to keep the heart artery open. Depending on your condition and health history, one of two types of stents may be placed: °· Drug-eluting stent (DES). A DES is coated with a medicine to prevent scar tissue from growing over the stent. With drug-eluting stents, blood thinning medicine will need to be taken for up to a year. °· Bare metal stent. This type of stent has no special coating to keep tissue from growing over it. This type of stent is used if you cannot take blood thinning medicine for a prolonged time or you need surgery in the near future. After a bare metal stent is placed, blood thinning medicine will need to be taken for about a month. °· If you are taking blood thinning medicine (anti-platelet therapy) after stent placement, do not stop taking it unless your caregiver says it is okay to do so. Make sure you understand how long you need to take the medicine. °Surgical Intervention °· If an acute intervention is not successful, surgery may be needed: °· Open heart surgery (coronary artery bypass graft, CABG). CABG takes a vein (saphenous vein) from your leg. The vein is then attached to the blocked heart artery which bypasses the blockage. This then allows blood flow to the heart muscle. °Additional Interventions °· A "clot buster" medicine (thrombolytic) may be given. This medicine can help break up a clot in the heart artery. This medicine may be given if a person cannot get to a cath lab right away. °· Intra-aortic balloon pump (IABP). If you have suffered a very severe MI and are too unstable to go  to the cath lab or to surgery, an IABP may be used. This is a temporary mechanical device used to increase blood flow to the heart and reduce the workload of the heart until you are stable enough to go to the cath lab or surgery. °HOME CARE INSTRUCTIONS °After an MI, you may need the following: °· Medicine. Take medicine as directed by your caregiver. Medicines after an MI may: °· Keep your blood from clotting easily (blood thinners). °· Control your blood pressure. °· Help lower your cholesterol. °· Control abnormal heart rhythms. °· Lifestyle changes. Under the guidance of your caregiver, lifestyle changes include: °· Quitting smoking, if you smoke. Your caregiver can help you quit. °· Being physically active. °· Maintaining a healthy weight. °· Eating a heart healthy diet. A dietitian can help you learn healthy eating options. °· Managing diabetes. °· Reducing stress. °· Limiting alcohol intake. °SEEK IMMEDIATE MEDICAL CARE IF:  °· You have severe chest pain, especially if the pain is crushing or   pressure-like and spreads to the arms, back, neck, or jaw. This is an emergency. Do not wait to see if the pain will go away. Get medical help at once. Call your local emergency services (911 in the [place] Do not drive yourself to the hospital. °· You have shortness of breath during rest, sleep, or with activity. °· You have sudden sweating or clammy skin. °· You feel sick to your stomach (nauseous) and throw up (vomit). °· You suddenly become lightheaded or dizzy. °· You feel your heart beating rapidly or you notice "skipped" beats. °MAKE SURE YOU:  °· Understand these instructions. °· Will watch your condition. °· Will get help right away if you are not doing well or get worse. °Document Released: 07/03/2005 Document Revised: 03/27/2012 Document Reviewed: 12/06/2010 °ExitCare® Patient Information ©2014 [employer]. °Coronary Angiography with Stent °Coronary angiography with stent placement is a procedure to widen or  open a narrow blood vessel of the heart (coronary artery). When a coronary artery becomes partially blocked, it decreases blood flow to that area. This may lead to chest pain or a heart attack (myocardial infarction). Arteries may become blocked by cholesterol buildup (plaque) in the lining or wall.  °A stent is a small piece of metal that looks like a mesh or a spring. Stent placement may be done right after a coronary angiography in which a blocked artery is found or as a treatment for a heart attack.  °LET YOUR HEALTH CARE PROVIDER KNOW ABOUT: °· Any allergies you have.   °· All medicines you are taking, including vitamins, herbs, eye drops, creams, and over-the-counter medicines.   °· Previous problems you or members of your family have had with the use of anesthetics.   °· Any blood disorders you have.   °· Previous surgeries you have had.   °· Medical conditions you have. °RISKS AND COMPLICATIONS °Generally, coronary angiography with stent is a safe procedure. However, as with any procedure, complications can occur. Possible complications include:  °· Damage to the heart or its blood vessels.   °· A return of blockage.   °· Bleeding at the site.   °· Blood clot in another part of the body.   °· Kidney injury.   °· Allergic reaction to the dye or contrast used.   °BEFORE THE PROCEDURE °· Do not eat or drink anything for 6 hours before the procedure.   °· Ask your health care provider if medicines can be taken with a sip of water.   °· Your health care provider will make sure you understand the procedure and the risks and potential complications associated with the procedure.   °PROCEDURE °· You may be given a medicine to help you relax before and during the procedure (sedative). This medicine will be given through an IV tube that is put into one of your veins.   °· The area where the catheter will be inserted is shaved and cleaned. This is usually done in the groin but may be done in the fold of your arm (near  your elbow) or in the wrist.    °· A medicine will be given to numb the area where the catheter will be inserted (local anesthetic).   °· The catheter is inserted into an artery using a guide wire. A type of X-ray (fluoroscopy) is used to help guide the catheter to the opening of the blocked artery.   °· A dye is then injected into the catheter, and X-rays are taken. The dye helps to show where any narrowing or blockages are located in the heart arteries.   °· A tiny wire   is guided to the blocked spot, and a balloon is inflated to make the artery wider. The stent is expanded and crushes the plaque into the wall of the vessel. The stent holds the area open like a scaffolding and improves the blood flow.   °· Sometimes the artery may be made wider using a laser or other tools to remove plaque.   °· When the blood flow is better, the catheter is removed. The lining of the artery will grow over the stent, which stays where it was placed.   °AFTER THE PROCEDURE °· If the procedure is done through the leg, you will be kept in bed lying flat for about 6 hours. You will be instructed to not bend or cross your legs.   °· The insertion site will be checked frequently.   °· The pulse in your feet or wrist will be checked frequently.   °· Additional blood tests, X-rays, and electrocardiography may be done. °Document Released: 01/07/2003 Document Revised: 04/23/2013 Document Reviewed: 01/09/2013 °ExitCare® Patient Information ©2014 [employer]. ° °

## 2013-08-10 NOTE — Progress Notes (Signed)
    Subjective:  Feels good, sitting up on side of bed. Comfortable. No significant shortness of breath. Cough chronic.  Objective:  Vital Signs in the last 24 hours: Temp:  [98 F (36.7 C)-98.8 F (37.1 C)] 98.6 F (37 C) (01/25 0416) Pulse Rate:  [58-65] 58 (01/25 0416) Resp:  [15-18] 18 (01/25 0416) BP: (107-116)/(58-73) 116/60 mmHg (01/25 0416) SpO2:  [96 %-100 %] 97 % (01/25 0416) Weight:  [159 lb 11.2 oz (72.439 kg)-160 lb 11.5 oz (72.9 kg)] 159 lb 11.2 oz (72.439 kg) (01/25 0416)  Intake/Output from previous day: 01/24 0701 - 01/25 0700 In: 480 [P.O.:480] Out: 300 [Urine:300]   Physical Exam: General: elderly appearing, in no acute distress. Head:  Normocephalic and atraumatic. Lungs: minimal wheeze, barrel chested, reduced air movement. Heart: Normal S1 and S2.  No murmur, rubs or gallops.  Abdomen: soft, non-tender, positive bowel sounds. Extremities: No clubbing or cyanosis. No edema. Neurologic: Alert and oriented x 3.    Lab Results:  Recent Labs  08/08/13 0220 08/08/13 0550  WBC 8.2 7.0  HGB 15.9 14.5  PLT 223 214    Recent Labs  08/08/13 0550 08/09/13 1000  NA 139 140  K 4.7 4.2  CL 103 103  CO2 22 23  GLUCOSE 213* 265*  BUN 17 14  CREATININE 0.67 0.57    Recent Labs  08/08/13 1050 08/09/13 1000  TROPONINI 19.23* 5.44*    I Telemetry: no adverse arrhythmias Personally viewed.     Assessment/Plan:  Active Problems:   STEMI (ST elevation myocardial infarction)   ST elevation myocardial infarction (STEMI) of inferior wall  1. STEMI-08/08/13 RCA stent x2. DES. Continue dual antiplatelet therapy. He wants prescriptions sent to right source. Understands importance of taking his medications. Ambulating comfortably.no adverse arrhythmias on monitor. Atorvastatin.  2. Tobacco cessation-he is not interested in quitting.  3. Ischemic cardiomyopathy-low-dose beta blocker, bisoprolol given lung disease,low-dose losartan. Appears volume  neutral.  Please have followup with either Dr. Clifton JamesMcAlhany or APP in one week.   [name] [name] 08/10/2013, 10:56 AM

## 2013-08-10 NOTE — Discharge Summary (Signed)
Patient ID: [name] [name],  MRN: [id number], DOB/AGE: [birth year] 73 y.o.  Admit date: 08/08/2013 Discharge date: 08/10/2013  Primary Care Provider:  Primary Cardiologist: Dr Perlie Gold (pt going back to [facility])  Discharge Diagnoses Active Problems:   ST elevation myocardial infarction (STEMI) of inferior wall   CAD - RCA DES x 2 08/08/13. Mild CFX, LAD disease   COPD (chronic obstructive pulmonary disease)   Dyslipidemia   HTN (hypertension)   Diabetes mellitus   Smoker    Procedures: Urgent cath, RCA DES x 2 08/08/13   Hospital Course:  73 y/o from [place], here visiting family when he developed SSCP. His symptoms were intermittent over 48 hrs prior to admission. He has a history of NIDDM and HTN and is a smoker. No prior CAD history. In the ER he had inferior ST elevation and STEMI was called. He was taken to the lab and had two DES placed in the RCA. He had mild residual disease in the LAD and CFX.  Post MI he was stable. He is going to return to [place] early February and will need a referral to a cardiologist there. Echo did show LVD with an EF of 30-35%. This will need to be followed up in 3 months. He was counseled on smoking cessation but at this time he is not interested in quitting.   Discharge Vitals:  Blood pressure 116/60, pulse 58, temperature 98.6 F (37 C), temperature source Oral, resp. rate 18, height _0  (1.676 m), weight 159 lb 11.2 oz (72.439 kg), SpO2 97.00%.    Labs: Results for orders placed during the hospital encounter of 08/08/13 (from the past 48 hour(s))  GLUCOSE, CAPILLARY     Status: Abnormal   Collection Time    08/08/13 11:58 AM      Result Value Range   Glucose-Capillary 205 (*) 70 - 99 mg/dL  GLUCOSE, CAPILLARY     Status: Abnormal   Collection Time    08/08/13  5:18 PM      Result Value Range   Glucose-Capillary 233 (*) 70 - 99 mg/dL  GLUCOSE, CAPILLARY     Status: Abnormal   Collection Time    08/08/13  9:45 PM      Result Value  Range   Glucose-Capillary 273 (*) 70 - 99 mg/dL  GLUCOSE, CAPILLARY     Status: Abnormal   Collection Time    08/09/13  7:39 AM      Result Value Range   Glucose-Capillary 198 (*) 70 - 99 mg/dL  TROPONIN I     Status: Abnormal   Collection Time    08/09/13 10:00 AM      Result Value Range   Troponin I 5.44 (*) <0.30 ng/mL   Comment:            Due to the release kinetics of cTnI,     a negative result within the first hours     of the onset of symptoms does not rule out     myocardial infarction with certainty.     If myocardial infarction is still suspected,     repeat the test at appropriate intervals.     CRITICAL VALUE NOTED.  VALUE IS CONSISTENT WITH PREVIOUSLY REPORTED AND CALLED VALUE.  BASIC METABOLIC PANEL     Status: Abnormal   Collection Time    08/09/13 10:00 AM      Result Value Range   Sodium 140  137 - 147 mEq/L   Potassium 4.2  3.7 - 5.3 mEq/L   Chloride 103  96 - 112 mEq/L   CO2 23  19 - 32 mEq/L   Glucose, Bld 265 (*) 70 - 99 mg/dL   BUN 14  6 - 23 mg/dL   Creatinine, Ser 0.57  0.50 - 1.35 mg/dL   Calcium 9.5  8.4 - 10.5 mg/dL   GFR calc non Af Amer >90  >90 mL/min   GFR calc Af Amer >90  >90 mL/min   Comment: (NOTE)     The eGFR has been calculated using the CKD EPI equation.     This calculation has not been validated in all clinical situations.     eGFR's persistently <90 mL/min signify possible Chronic Kidney     Disease.  GLUCOSE, CAPILLARY     Status: Abnormal   Collection Time    08/09/13 11:37 AM      Result Value Range   Glucose-Capillary 263 (*) 70 - 99 mg/dL  GLUCOSE, CAPILLARY     Status: Abnormal   Collection Time    08/09/13  4:42 PM      Result Value Range   Glucose-Capillary 186 (*) 70 - 99 mg/dL   Comment 1 Notify RN     Comment 2 Documented in Chart    GLUCOSE, CAPILLARY     Status: Abnormal   Collection Time    08/09/13  9:05 PM      Result Value Range   Glucose-Capillary 178 (*) 70 - 99 mg/dL  GLUCOSE, CAPILLARY     Status:  Abnormal   Collection Time    08/10/13  6:56 AM      Result Value Range   Glucose-Capillary 208 (*) 70 - 99 mg/dL    Disposition:  Follow-up Information   Follow up with Lauree Chandler, MD. (office will call you)    Specialty:  Cardiology   Contact information:   [place] [place] [address] [phone]       Discharge Medications:    Medication List    STOP taking these medications       ANTIHISTAMINE PO     lisinopril 10 MG tablet  Commonly known as:  PRINIVIL,ZESTRIL      TAKE these medications       acetaminophen 325 MG tablet  Commonly known as:  TYLENOL  Take 2 tablets (650 mg total) by mouth every 4 (four) hours as needed for headache or mild pain.     aspirin 81 MG EC tablet  Take 1 tablet (81 mg total) by mouth daily.     atorvastatin 40 MG tablet  Commonly known as:  LIPITOR  Take 1 tablet (40 mg total) by mouth daily.     bisoprolol 5 MG tablet  Commonly known as:  ZEBETA  Take 1 tablet (5 mg total) by mouth daily.     HYDROcodone-acetaminophen 5-325 MG per tablet  Commonly known as:  NORCO/VICODIN  Take 1 tablet by mouth every 6 (six) hours as needed for moderate pain.     losartan 25 MG tablet  Commonly known as:  COZAAR  Take 1 tablet (25 mg total) by mouth daily.     metFORMIN 500 MG tablet  Commonly known as:  GLUCOPHAGE  Take 1-2 tablets (500-1,000 mg total) by mouth 2 (two) times daily with a meal. Take $RemoveBe'500mg'idSavUtLG$  in the morning and $RemoveBef'1000mg'ugLSrPTodD$  at supper.  Start taking on:  08/11/2013     nitroGLYCERIN 0.4 MG SL tablet  Commonly known  as:  NITROSTAT  Place 1 tablet (0.4 mg total) under the tongue every 5 (five) minutes x 3 doses as needed for chest pain.     tamsulosin 0.4 MG Caps capsule  Commonly known as:  FLOMAX  Take 0.4 mg by mouth daily.     Ticagrelor 90 MG Tabs tablet  Commonly known as:  BRILINTA  Take 1 tablet (90 mg total) by mouth 2 (two) times daily.         Duration of Discharge Encounter:  Greater than 30 minutes including physician time.  Angelena Form PA-C 08/10/2013 11:26 AM

## 2013-08-11 ENCOUNTER — Other Ambulatory Visit: Payer: Self-pay | Admitting: *Deleted

## 2013-08-11 MED ORDER — NITROGLYCERIN 0.4 MG SL SUBL: 0.4000 mg | SUBLINGUAL_TABLET | SUBLINGUAL | Status: AC | PRN

## 2013-08-11 MED ORDER — TICAGRELOR 90 MG PO TABS: 90.0000 mg | ORAL_TABLET | Freq: Two times a day (BID) | ORAL | Status: DC

## 2013-08-11 MED FILL — Sodium Chloride IV Soln 0.9%: INTRAVENOUS | Qty: 50 | Status: AC

## 2013-08-12 LAB — GLUCOSE, CAPILLARY: Glucose-Capillary: 199 mg/dL — ABNORMAL HIGH (ref 70–99)

## 2013-08-22 ENCOUNTER — Encounter: Payer: Self-pay | Admitting: Cardiovascular Disease

## 2013-08-22 ENCOUNTER — Ambulatory Visit (INDEPENDENT_AMBULATORY_CARE_PROVIDER_SITE_OTHER): Payer: Medicare Other | Admitting: Cardiovascular Disease

## 2013-08-22 VITALS — BP 140/68 | HR 54 | Ht 66.0 in | Wt 166.0 lb

## 2013-08-22 DIAGNOSIS — I2589 Other forms of chronic ischemic heart disease: Secondary | ICD-10-CM

## 2013-08-22 DIAGNOSIS — I255 Ischemic cardiomyopathy: Secondary | ICD-10-CM

## 2013-08-22 DIAGNOSIS — F17200 Nicotine dependence, unspecified, uncomplicated: Secondary | ICD-10-CM

## 2013-08-22 DIAGNOSIS — I10 Essential (primary) hypertension: Secondary | ICD-10-CM

## 2013-08-22 DIAGNOSIS — I2510 Atherosclerotic heart disease of native coronary artery without angina pectoris: Secondary | ICD-10-CM

## 2013-08-22 DIAGNOSIS — Z720 Tobacco use: Secondary | ICD-10-CM

## 2013-08-22 NOTE — Patient Instructions (Signed)
Your physician wants you to follow-up in:  2 months.  You will receive a reminder letter in the mail two months in advance. If you don't receive a letter, please call our office to schedule the follow-up appointment.

## 2013-08-22 NOTE — Progress Notes (Signed)
History of Present Illness: 73 yo male with history of CAD, tobacco abuse, HTN, DM who is here today for cardiac follow up after recent admission to [facility] 08/08/13 with inferior STEMI. Cardiac cath 08/08/13 with sub-total occlusion in the mid RCA treated with overlapping 2.75 x 24 mm Promus Premier DES and 2.5 x 12 mm Promus Premier DES. He had minor plaque disease in the LAD and Circumflex. LVEF=45%, hypokinesis of the inferior wall at the apex Echo 08/08/13 with LVEF=30-35% with with inferior, inferoseptal, and posterior akinesis. Mildly dilated RV with mild to moderate RV systolic dysfunction. He was discharged home on ASA/Brilinta/beta blocker and ARB.   He is here today for cardiac follow up. He is feeling well. No chest pain or SOB. No lower ext edema.   Primary Care Physician: None  Last Lipid Profile:Lipid Panel     Component Value Date/Time   CHOL 133 08/08/2013 0550   TRIG 342* 08/08/2013 0550   HDL 24* 08/08/2013 0550   CHOLHDL 5.5 08/08/2013 0550   VLDL 68* 08/08/2013 0550   LDLCALC 41 08/08/2013 0550    Past Medical History  Diagnosis Date  . Hypertension   . Diabetes mellitus without complication   . CAD (coronary artery disease)     Inferior STEMI January 2015, sub-totally occluded RCA with overlapping DES placed mid RCA  . Ischemic cardiomyopathy   . Tobacco abuse     Past Surgical History  Procedure Laterality Date  . Neck surgery    . Back surgery    . Hernia repair      Current Outpatient Prescriptions  Medication Sig Dispense Refill  . acetaminophen (TYLENOL) 325 MG tablet Take 2 tablets (650 mg total) by mouth every 4 (four) hours as needed for headache or mild pain.      Marland Kitchen aspirin EC 81 MG EC tablet Take 1 tablet (81 mg total) by mouth daily.      Marland Kitchen atorvastatin (LIPITOR) 40 MG tablet Take 1 tablet (40 mg total) by mouth daily.  30 tablet  5  . HYDROcodone-acetaminophen (NORCO/VICODIN) 5-325 MG per tablet Take 1 tablet by mouth every 6 (six) hours  as needed for moderate pain.       Marland Kitchen losartan (COZAAR) 25 MG tablet Take 1 tablet (25 mg total) by mouth daily.  30 tablet  5  . metFORMIN (GLUCOPHAGE) 500 MG tablet Take 1-2 tablets (500-1,000 mg total) by mouth 2 (two) times daily with a meal. Take 500mg  in the morning and 1000mg  at supper.      . nitroGLYCERIN (NITROSTAT) 0.4 MG SL tablet Place 1 tablet (0.4 mg total) under the tongue every 5 (five) minutes x 3 doses as needed for chest pain.  25 tablet  2  . tamsulosin (FLOMAX) 0.4 MG CAPS capsule Take 0.4 mg by mouth daily.       . Ticagrelor (BRILINTA) 90 MG TABS tablet Take 1 tablet (90 mg total) by mouth 2 (two) times daily.  60 tablet  1  . bisoprolol (ZEBETA) 5 MG tablet Take 1 tablet (5 mg total) by mouth daily.  30 tablet  5   No current facility-administered medications for this visit.    No Known Allergies  History   Social History  . Marital Status: Single    Spouse Name: N/A    Number of Children: 4  . Years of Education: N/A   Occupational History  . Retired    Social History Main Topics  . Smoking status:  Current Every Day Smoker -- 2.00 packs/day for 60 years    Types: Cigarettes  . Smokeless tobacco: Not on file  . Alcohol Use: 0.5 oz/week    1 drink(s) per week     Comment: occasionally  . Drug Use: No  . Sexual Activity: Not on file   Other Topics Concern  . Not on file   Social History Narrative  . No narrative on file    Family History  Problem Relation Age of Onset  . CAD Neg Hx     Review of Systems:  As stated in the HPI and otherwise negative.   BP 140/68  Pulse 54  Ht 5\' 6"  (1.676 m)  Wt 166 lb (75.297 kg)  BMI 26.81 kg/m2  Physical Examination: General: Well developed, well nourished, NAD HEENT: OP clear, mucus membranes moist SKIN: warm, dry. No rashes. Neuro: No focal deficits Musculoskeletal: Muscle strength 5/5 all ext Psychiatric: Mood and affect normal Neck: No JVD, no carotid bruits, no thyromegaly, no  lymphadenopathy. Lungs:Clear bilaterally, no wheezes, rhonci, crackles Cardiovascular: Regular rate and rhythm. No murmurs, gallops or rubs. Abdomen:Soft. Bowel sounds present. Non-tender.  Extremities: No lower extremity edema. Pulses are 2 + in the bilateral DP/PT.  ZOX:WRUEAEKG:Sinus brady, rate 54 bpm. RBBB. T wave inversion inferior leads.   Cardiac cath 08/08/13: Left main: No obstructive disease.  Left Anterior Descending Artery: Large caliber vessel that courses to the apex. Diffuse 30% stenosis in the mid LAD. The first diagonal is small in caliber with 30% ostial stenosis.  Circumflex Artery: Large caliber vessel with 30% mid stenosis, small caliber first obtuse marginal branch and large caliber second obtuse marginal branch. There is 20% stenosis at the ostium of the second obtuse marginal branch. No obstructive disease.  Right Coronary Artery: Moderate caliber dominant vessel with mid vessel calcification, 99% sub-total occlusion in the mid vessel. The distal vessel has mild plaque disease. The PDA is small in caliber and has mild diffuse disease.  Left Ventricular Angiogram: LVEF=45%, hypokinesis of the inferior wall at the apex.  Impression:  1. Acute inferior STEMI secondary to sub-totally occluded mid RCA  2. Successful PTCA/DES x 2 mid RCA  3. Mild disease in LAD and Circumflex  4. Mild segmental LV systolic dysfunction  Echo 08/08/13: Left ventricle: The cavity size was normal. Wall thickness was increased in a pattern of mild LVH. Systolic function was moderately to severely reduced. The estimated ejection fraction was in the range of 30% to 35%. Inferoseptal, inferior, and posterior akinesis. Doppler parameters are consistent with abnormal left ventricular relaxation (grade 1 diastolic dysfunction). - Aortic valve: There was no stenosis. - Aorta: Mildly dilated aortic root. Aortic root dimension: 38mm (ED). - Mitral valve: No significant regurgitation. - Right ventricle: The  cavity size was mildly dilated. Systolic function was mildly to moderately reduced. - Right atrium: The atrium was mildly dilated. - Pulmonary arteries: No complete TR doppler jet so unable to estimate PA systolic pressure. - Systemic veins: IVC measured 2.3 cm with > 50% respirophasic variation, suggesting RA pressure 8 mmHg. Impressions:  - Normal LV size with mild LV hypertrophy. EF 30-35% with inferior, inferoseptal, and posterior akinesis. Mildly dilated RV with mild to moderate RV systolic dysfunction (? RV involved in MI). No significant valvular abnormalities.  Assessment and Plan:   1. CAD: Recent inferior STEMI 08/08/13 with subtotally occluded RCA treated with overlapping DES x 2. He is doing well. He is on ASA, Brilinta, statin, beta blocker and ARB. Samples  of Brilinta given today. He is told not to stop this medication. If he cannot afford in several months, will change to Plavix. Would like to complete at least 3 months of Brilinta post PCI before changing to Plavix if possible. He [place] 200 miles from here. He wants to find a cardiologist near home. I will see him back in 2 months and will repeat echo then unless he has a new cardiologist near home.   2. Ischemic cardiomyopathy: LVEF=30-35% by echo 08/08/13 immediately post MI. Will continue medical therapy for now and repeat echo in 6-8 weeks to reassess LVEF  3. Tobacco abuse: Smoking cessation is recommended.   4. HTN: BP is controlled. Continue current therapy

## 2013-08-29 ENCOUNTER — Other Ambulatory Visit: Payer: Self-pay | Admitting: *Deleted

## 2013-08-29 MED ORDER — BISOPROLOL FUMARATE 5 MG PO TABS: 5.0000 mg | ORAL_TABLET | Freq: Every day | ORAL | Status: DC

## 2013-08-29 MED ORDER — ATORVASTATIN CALCIUM 40 MG PO TABS: 40.0000 mg | ORAL_TABLET | Freq: Every day | ORAL | Status: DC

## 2013-08-29 MED ORDER — LOSARTAN POTASSIUM 25 MG PO TABS: 25.0000 mg | ORAL_TABLET | Freq: Every day | ORAL | Status: DC

## 2013-08-29 MED ORDER — TICAGRELOR 90 MG PO TABS: 90.0000 mg | ORAL_TABLET | Freq: Two times a day (BID) | ORAL | Status: DC

## 2014-02-18 ENCOUNTER — Other Ambulatory Visit: Payer: Self-pay | Admitting: Cardiovascular Disease

## 2014-05-11 ENCOUNTER — Other Ambulatory Visit: Payer: Self-pay | Admitting: Family Medicine

## 2014-05-11 ENCOUNTER — Ambulatory Visit
Admission: RE | Admit: 2014-05-11 | Discharge: 2014-05-11 | Disposition: A | Discharge status: Home / Self Care | Payer: Medicare Other | Source: Ambulatory Visit | Attending: Family Medicine | Admitting: Family Medicine

## 2014-05-11 DIAGNOSIS — T1590XA Foreign body on external eye, part unspecified, unspecified eye, initial encounter: Secondary | ICD-10-CM

## 2014-05-11 DIAGNOSIS — M5442 Lumbago with sciatica, left side: Secondary | ICD-10-CM

## 2014-06-25 ENCOUNTER — Encounter (HOSPITAL_COMMUNITY): Payer: Self-pay | Admitting: Cardiovascular Disease

## 2014-07-23 ENCOUNTER — Telehealth: Payer: Self-pay | Admitting: Cardiovascular Disease

## 2014-07-23 NOTE — Telephone Encounter (Signed)
Pt last seen in February 2015. I spoke with pt and appt made for him to see Tereso NewcomerScott Weaver, PA on August 13, 2014 for surgical clearance.

## 2014-07-23 NOTE — Telephone Encounter (Signed)
New message     Request for surgical clearance:  1. What type of surgery is being performed? Back surgery   2. When is this surgery scheduled? no  3. Are there any medications that need to be held prior to surgery and how long?  Pt is not sure  4. Name of physician performing surgery? Dr Chancy HurterNundkumar--[facility]   5. What is your office phone and fax number? [phone]   Fax [phone]  6. Pt needs to be cleared because he had a heart attack 08-08-13

## 2014-08-13 ENCOUNTER — Ambulatory Visit (INDEPENDENT_AMBULATORY_CARE_PROVIDER_SITE_OTHER): Payer: Medicare Other | Admitting: Physician Assistant

## 2014-08-13 ENCOUNTER — Encounter: Payer: Self-pay | Admitting: Physician Assistant

## 2014-08-13 VITALS — BP 163/75 | HR 53 | Ht 66.0 in | Wt 151.0 lb

## 2014-08-13 DIAGNOSIS — I10 Essential (primary) hypertension: Secondary | ICD-10-CM

## 2014-08-13 DIAGNOSIS — I2510 Atherosclerotic heart disease of native coronary artery without angina pectoris: Secondary | ICD-10-CM

## 2014-08-13 DIAGNOSIS — R079 Chest pain, unspecified: Secondary | ICD-10-CM

## 2014-08-13 DIAGNOSIS — E785 Hyperlipidemia, unspecified: Secondary | ICD-10-CM

## 2014-08-13 DIAGNOSIS — Z01818 Encounter for other preprocedural examination: Secondary | ICD-10-CM

## 2014-08-13 DIAGNOSIS — Z720 Tobacco use: Secondary | ICD-10-CM

## 2014-08-13 DIAGNOSIS — I255 Ischemic cardiomyopathy: Secondary | ICD-10-CM

## 2014-08-13 MED ORDER — LOSARTAN POTASSIUM 100 MG PO TABS: 100.0000 mg | ORAL_TABLET | Freq: Every day | ORAL | Status: DC

## 2014-08-13 NOTE — Patient Instructions (Signed)
Your physician recommends that you return for lab work in: 1 WEEK WHEN YOU HAVE YOUR STRESS TEST  Your physician has requested that you have a lexiscan myoview. For further information please visit [email or web address] Please follow instruction sheet, as given.  Your physician has requested that you have an echocardiogram. Echocardiography is a painless test that uses sound waves to create images of your heart. It provides your doctor with information about the size and shape of your heart and how well your heart's chambers and valves are working. This procedure takes approximately one hour. There are no restrictions for this procedure.  Your physician has recommended you make the following change in your medication:  1. INCREASE COZAAR TO 100 MG DAILY; NEW RX WAS SENT IN FOR THE 100 MG TABLET  Your physician wants you to follow-up in: 6 MONTHS WITH DR. Clifton JamesMCALHANY You will receive a reminder letter in the mail two months in advance. If you don't receive a letter, please call our office to schedule the follow-up appointment.

## 2014-08-13 NOTE — Progress Notes (Signed)
Cardiology Office Note   Date:  08/13/2014   ID:  [name] [name], DOB 12-04-1940, MRN [id number]  PCP:  Lolita Patella, MD  Cardiologist:  Dr. Verne Carrow     Chief Complaint  Patient presents with  . Surgical Clearance  . Coronary Artery Disease  . Cardiomyopathy     History of Present Illness: [name] [name] is a 74 y.o. male who presents for FU of the above.   She has a history of CAD, tobacco abuse, HTN, DM.  He was admitted to [facility] 08/08/13 with inferior STEMI. Cardiac cath 08/08/13 with sub-total occlusion in the mid RCA treated with overlapping 2.75 x 24 mm Promus Premier DES and 2.5 x 12 mm Promus Premier DES. He had minor plaque disease in the LAD and Circumflex. LVEF=45%, hypokinesis of the inferior wall at the apex  Echo 08/08/13 with LVEF=30-35% with with inferior, inferoseptal, and posterior akinesis. Mildly dilated RV with mild to moderate RV systolic dysfunction. He was discharged home on ASA/Brilinta/beta blocker and ARB.   Last seen by Dr. Clifton James 08/22/13. Plan was to have him follow-up in 2 months with a repeat echocardiogram. However, he has not been seen since that time.  He tells me that he is here for surgical clearance for lumbar spine surgery with Dr. Lisbeth Renshaw.  Of note, we later learned that the patient is not being considered for surgery and Dr. Conchita Paris did not refer him here. The patient does note occasional chest tightness. This comes and goes. He is not that active. It is not clearly exertional. He has chronic dyspnea with exertion. He is probably NYHA IIb. He denies orthopnea, PND or edema. He denies syncope.   Studies/Reports Reviewed Today:  - LHC (08/08/13):  mLAD 30, oD1 30, mCFX 30, oOM2 20, mRCA 99, EF 45% with inf HK >> PCI:  2.75 x 24 mm and 2.5 x 12 mm Promus Premier DES to the mRCA  - Echocardiogram (08/08/13):  Mild LVH, EF 30-35%, inferoseptal, inferior and posterior akinesis, grade 1 diastolic  dysfunction, mildly dilated aortic root (38 mm), mild RVE, mild to moderately reduced RVSF, mild RAE   Past Medical History  Diagnosis Date  . Hypertension   . Diabetes mellitus without complication   . CAD (coronary artery disease)     Inferior STEMI January 2015, sub-totally occluded RCA with overlapping DES placed mid RCA  . Ischemic cardiomyopathy     Echocardiogram (08/08/13):  Mild LVH, EF 30-35%, inferoseptal, inferior and posterior akinesis, grade 1 diastolic dysfunction, mildly dilated aortic root (38 mm), mild RVE, mild to moderately reduced RVSF, mild RAE  . Tobacco abuse   . HLD (hyperlipidemia)     Past Surgical History  Procedure Laterality Date  . Neck surgery    . Back surgery    . Hernia repair    . Left heart cath N/A 08/08/2013    Procedure: LEFT HEART CATH;  Surgeon: Kathleene Hazel, MD;  Location: [facility] CATH LAB;  Service: Cardiovascular;  Laterality: N/A;  . Percutaneous coronary stent intervention (pci-s)  08/08/2013    Procedure: PERCUTANEOUS CORONARY STENT INTERVENTION (PCI-S);  Surgeon: Kathleene Hazel, MD;  Location: [facility] CATH LAB;  Service: Cardiovascular;;  DES x2 Mid RCA     Current Outpatient Prescriptions  Medication Sig Dispense Refill  . aspirin EC 81 MG EC tablet Take 1 tablet (81 mg total) by mouth daily.    Marland Kitchen atorvastatin (LIPITOR) 40 MG tablet TAKE 1 TABLET DAILY. 90 tablet  0  . bisoprolol (ZEBETA) 5 MG tablet TAKE 1 TABLET EVERY DAY 90 tablet 0  . BRILINTA 90 MG TABS tablet TAKE 1 TABLET TWICE DAILY 180 tablet 0  . HYDROcodone-acetaminophen (NORCO/VICODIN) 5-325 MG per tablet Take 1 tablet by mouth every 6 (six) hours as needed for moderate pain.     Marland Kitchen. losartan (COZAAR) 50 MG tablet Take 50 mg by mouth daily.    . metFORMIN (GLUCOPHAGE) 500 MG tablet Take 1-2 tablets (500-1,000 mg total) by mouth 2 (two) times daily with a meal. Take 500mg  in the morning and 1000mg  at supper.    . nitroGLYCERIN (NITROSTAT) 0.4 MG SL tablet Place 1  tablet (0.4 mg total) under the tongue every 5 (five) minutes x 3 doses as needed for chest pain. 25 tablet 2  . vitamin B-12 (CYANOCOBALAMIN) 1000 MCG tablet Take 1,000 mcg by mouth daily.     No current facility-administered medications for this visit.    Allergies:   Review of patient's allergies indicates no known allergies.    Social History:  The patient  reports that he has been smoking Cigarettes.  He has a 120 pack-year smoking history. He does not have any smokeless tobacco history on file. He reports that he drinks about 0.5 oz of alcohol per week. He reports that he does not use illicit drugs.   Family History:  The patient's family history is negative for CAD, Heart attack, and Stroke.    ROS:  Please see the history of present illness.   Otherwise, review of systems are positive for back pain, occasional diarrhea.   All other systems are reviewed and negative.    PHYSICAL EXAM: VS:  BP 163/75 mmHg  Pulse 53  Ht 5\' 6"  (1.676 m)  Wt 151 lb (68.493 kg)  BMI 24.38 kg/m2    Wt Readings from Last 3 Encounters:  08/13/14 151 lb (68.493 kg)  08/22/13 166 lb (75.297 kg)  08/10/13 159 lb 11.2 oz (72.439 kg)     GEN: Well nourished, well developed, in no acute distress HEENT: normal Neck: no JVD, no carotid bruits, no masses Cardiac:  Normal S1/S2, RRR; no murmur,  no rubs or gallops, no edema  Respiratory:  Decreased breath sounds bilaterally, no wheezing, rhonchi or rales. GI: soft, nontender, nondistended, + BS MS: no deformity or atrophy Skin: warm and dry  Neuro:  CNs II-XII intact, Strength and sensation are intact Psych: Normal affect   EKG:  EKG is ordered today.  It demonstrates:   Sinus bradycardia, HR 53, inferior Q waves, RBBB   Recent Labs: No results found for requested labs within last 365 days.    Lipid Panel    Component Value Date/Time   CHOL 133 08/08/2013 0550   TRIG 342* 08/08/2013 0550   HDL 24* 08/08/2013 0550   CHOLHDL 5.5 08/08/2013  0550   VLDL 68* 08/08/2013 0550   LDLCALC 41 08/08/2013 0550      ASSESSMENT AND PLAN:  1.  Surgical Clearance: The patient tells me that he presents to our office today to be cleared for lumbar spine surgery with [facility] (Dr. Conchita ParisNundkumar).  Our medical records department did contact his office. Apparently, patient is not being considered for surgery and was not referred here by their office. He has had some chest discomfort. He has not been reevaluated for his ischemic cardiomyopathy and severely reduced ejection fraction.    -  Arrange Lexiscan Myoview for risk stratification.    -  Obtain follow-up echocardiogram. 2.  Coronary Artery Disease: It has been 1 year since his PCI with a drug-eluting stent in the setting of an inferior ST elevation myocardial infarction. Typically, dual antiplatelet therapy can be discontinued 12 months out from PCI in the setting of ACS. As noted, the patient has had some chest discomfort. Proceed with Myoview as noted. If this is low risk, the patient would be able to hold Brilinta for his surgery (if the decision is made to proceed). He would need to remain on aspirin long-term. If this needs to be held for surgery, it should be resumed postoperatively as soon as possible. If his Myoview is abnormal, he will need further evaluation.    -  Continue aspirin, Brilinta, bisoprolol, losartan, atorvastatin. 3.  Ischemic Cardiomyopathy: He does not have any evidence of volume excess. His ejection fraction has not been reevaluated since the time of his MI. Continue ARB and beta blocker therapy.    -  Arrange echocardiogram as outlined above. If EF remains less than 35%, consider referral to EP for consideration of AICD. 4.  Hypertension: Blood pressure is above target. Increase losartan to 100 mg daily. Obtain basic metabolic panel in one week. 5.  Hyperlipidemia: This is managed by primary care. I will request his most recent lipid panel  from his primary care physician. 6.  Tobacco abuse: I have advised him to quit. We discussed the importance of tobacco cessation.  Current medicines are reviewed at length with the patient today.  The patient does not have concerns regarding medicines.  The following changes have been made:  As above.   Labs/ tests ordered today include:  Orders Placed This Encounter  Procedures  . Basic Metabolic Panel (BMET)  . Myocardial Perfusion Imaging  . EKG 12-Lead  . 2D Echocardiogram without contrast     Disposition:   FU with Dr. Verne Carrow in 6 months or sooner if nuclear stress test is abnormal.   Signed, Brynda Rim, MHS 08/13/2014 2:54 PM    [facility] [address] [place], [place], [place]  [address] Phone: [phone]; Fax: [phone]

## 2014-08-17 ENCOUNTER — Other Ambulatory Visit: Payer: Self-pay

## 2014-08-17 DIAGNOSIS — I10 Essential (primary) hypertension: Secondary | ICD-10-CM

## 2014-08-17 MED ORDER — LOSARTAN POTASSIUM 100 MG PO TABS: 100.0000 mg | ORAL_TABLET | Freq: Every day | ORAL | Status: DC

## 2014-08-19 ENCOUNTER — Telehealth: Payer: Self-pay

## 2014-08-19 NOTE — Telephone Encounter (Signed)
Patient called could not get his losartan filled at [facility]. His insurance had place it on hold. I called [employer] and got them to put back his med so he could get refill at [facility]

## 2014-08-20 ENCOUNTER — Ambulatory Visit (HOSPITAL_COMMUNITY): Payer: Medicare Other | Attending: Cardiology | Admitting: Radiology

## 2014-08-20 DIAGNOSIS — I2510 Atherosclerotic heart disease of native coronary artery without angina pectoris: Secondary | ICD-10-CM

## 2014-08-20 DIAGNOSIS — R079 Chest pain, unspecified: Secondary | ICD-10-CM

## 2014-08-20 MED ORDER — TECHNETIUM TC 99M SESTAMIBI GENERIC - CARDIOLITE
33.0000 | Freq: Once | INTRAVENOUS | Status: AC | PRN
  Administered 2014-08-20: 33 via INTRAVENOUS

## 2014-08-20 MED ORDER — REGADENOSON 0.4 MG/5ML IV SOLN
0.4000 mg | Freq: Once | INTRAVENOUS | Status: AC
  Administered 2014-08-20: 0.4 mg via INTRAVENOUS

## 2014-08-20 MED ORDER — TECHNETIUM TC 99M SESTAMIBI GENERIC - CARDIOLITE
11.0000 | Freq: Once | INTRAVENOUS | Status: AC | PRN
  Administered 2014-08-20: 11 via INTRAVENOUS

## 2014-08-20 NOTE — Progress Notes (Signed)
[facility] SITE 3 NUCLEAR MED [address] [place], [place] [address] [phone]    Cardiology Nuclear Med Study  [name] [name] is a 74 y.o. male     MRN : [id number]     DOB: 03/31/1941  Procedure Date: 08/20/2014  Nuclear Med Background Indication for Stress Test:  Evaluation for Ischemia, Stent Patency, and Pending Surgical Clearance for  Lumbar Spine Surgery by Dr. Lisbeth RenshawNeelesh Nundkumar, MD History:  CAD-STENT(2013) Cardiac Risk Factors: Hypertension and NIDDM  Symptoms:  Chest Pain, DOE and SOB   Nuclear Pre-Procedure Caffeine/Decaff Intake:  10:30pm 4 oz coke  NPO After: 10:30pm   Lungs:  clear O2 Sat: 98% on room air. IV 0.9% NS with Angio Cath:  20g  IV Site: R Antecubital x 1, tolerated well IV Started by:  Irean HongPatsy Edwards, RN  Chest Size (in):  38 Cup Size: n/a  Height: 5\' 6"  (1.676 m)  Weight:  149 lb (67.586 kg)  BMI:  Body mass index is 24.06 kg/(m^2). Tech Comments:  Patient took morning medications but held metformin. Patsy Edwards. RN    Nuclear Med Study 1 or 2 day study: 1 day  Stress Test Type:  Lexiscan  Reading MD: N/A  Order Authorizing Provider:  Tereso NewcomerScott Weaver, PAC  Resting Radionuclide: Technetium 7728m Sestamibi  Resting Radionuclide Dose: 11.0 mCi   Stress Radionuclide:  Technetium 8728m Sestamibi  Stress Radionuclide Dose: 33.0 mCi           Stress Protocol Rest HR: 51 Stress HR: 68  Rest BP: 128/57 Stress BP: 163/68  Exercise Time (min): n/a METS: n/a   Predicted Max HR: 145 bpm % Max HR: 46.9 bpm Rate Pressure Product: [id number]   Dose of Adenosine (mg):  n/a Dose of Lexiscan: 0.4 mg  Dose of Atropine (mg): n/a Dose of Dobutamine: n/a mcg/kg/min (at max HR)  Stress Test Technologist: Milana NaSabrina Williams, EMT-P  Nuclear Technologist:  Kerby NoraElzbieta Kubak, CNMT     Rest Procedure:  Myocardial perfusion imaging was performed at rest 45 minutes following the intravenous administration of Technetium 3528m Sestamibi. Rest ECG: NSR with non-specific  ST-T wave changes and RBBB  Stress Procedure:  The patient received IV Lexiscan 0.4 mg over 15-seconds.  Technetium 2728m Sestamibi injected at 30-seconds. This patient had sob and chest tightness with the Lexiscan injection. Quantitative spect images were obtained after a 45 minute delay. Stress ECG: No significant change from baseline ECG  QPS Raw Data Images:  Normal; no motion artifact; normal heart/lung ratio. Stress Images:  There is decreased uptake in the apex. Rest Images:  Comparison with the stress images reveals no significant change. Subtraction (SDS):  There is a fixed defect that is most consistent with a previous infarction. The defect is small in size and moderate in intensity Transient Ischemic Dilatation (Normal <1.22):  0.96 Lung/Heart Ratio (Normal <0.45):  0.30  Quantitative Gated Spect Images QGS EDV:  107 ml QGS ESV:  49 ml  Impression Exercise Capacity:  Lexiscan with no exercise. BP Response:  Normal blood pressure response. Clinical Symptoms:  No significant symptoms noted. ECG Impression:  No significant ECG changes with Lexiscan. Comparison with Prior Nuclear Study: No images to compare  Overall Impression:  Low risk stress nuclear study with a small fixed apical defect, suggestive of previous infarction. No reversible ischemia is seen.  LV Ejection Fraction: 54%.  LV Wall Motion:  NL LV Function; NL Wall Motion   Thurmon FairMihai Achaia Garlock, MD, [facility] [phone] office [phone] pager

## 2014-08-21 ENCOUNTER — Encounter: Payer: Self-pay | Admitting: Physician Assistant

## 2014-08-24 ENCOUNTER — Telehealth: Payer: Self-pay | Admitting: *Deleted

## 2014-08-24 NOTE — Telephone Encounter (Signed)
pt notified about myoview results. Pt stated to me that her s/w [name] [name] in scheduling and was scheduled for echo and bmet. Pt was not sure about these so I explained per 1/28 ov w/Scott W. PA these test were ordered. Pt agreeable to plan of care.

## 2014-08-27 ENCOUNTER — Other Ambulatory Visit (INDEPENDENT_AMBULATORY_CARE_PROVIDER_SITE_OTHER): Payer: Medicare Other | Admitting: *Deleted

## 2014-08-27 ENCOUNTER — Ambulatory Visit (HOSPITAL_COMMUNITY): Payer: Medicare Other | Attending: Family Medicine

## 2014-08-27 DIAGNOSIS — I10 Essential (primary) hypertension: Secondary | ICD-10-CM

## 2014-08-27 DIAGNOSIS — I2510 Atherosclerotic heart disease of native coronary artery without angina pectoris: Secondary | ICD-10-CM

## 2014-08-27 LAB — BASIC METABOLIC PANEL
BUN: 18 mg/dL (ref 6–23)
CALCIUM: 9.7 mg/dL (ref 8.4–10.5)
CHLORIDE: 101 meq/L (ref 96–112)
CO2: 29 meq/L (ref 19–32)
Creatinine, Ser: 0.8 mg/dL (ref 0.40–1.50)
GFR: 99.89 mL/min (ref >60.00)
Glucose, Bld: 176 mg/dL — ABNORMAL HIGH (ref 70–99)
Potassium: 4.5 mEq/L (ref 3.5–5.1)
SODIUM: 134 meq/L — AB (ref 135–145)

## 2014-08-27 NOTE — Progress Notes (Signed)
2D Echo completed. 08/27/2014 

## 2014-08-28 ENCOUNTER — Encounter: Payer: Self-pay | Admitting: Physician Assistant

## 2014-08-28 ENCOUNTER — Telehealth: Payer: Self-pay | Admitting: *Deleted

## 2014-08-28 NOTE — Telephone Encounter (Signed)
pt notified about echo results with verbal understanding. Pt wants to know does this mean he has been cleared for surgery. I said I will ask Bing NeighborsScott W. PA and cb later, pt said ok and thank you.

## 2014-08-28 NOTE — Telephone Encounter (Signed)
pt aware per Tereso NewcomerScott Weaver, PA that pt does not need any further testing on our behalf however; PA stated that surgeon's office notified us that he was not being considered for surgery. I explained this to pt and he will call surgeon to discuss further. Pt asked for his echo results to be faxed to him as well.

## 2014-09-04 ENCOUNTER — Telehealth: Payer: Self-pay | Admitting: Physician Assistant

## 2014-09-04 NOTE — Telephone Encounter (Signed)
I reviewed the patient with Dr. Verne Carrowhristopher McAlhany. We have sent surgical clearance information to his surgeon.  After review with Dr. Verne Carrowhristopher McAlhany, we plan to keep [name] [name] on dual antiplatelet Rx for a prolonged period of time.  He will be able to hold his Brilinta for surgery and resume it afterward when cleared by surgery.  We want him to change from Brilinta to Plavix eventually.  This can be done after his surgery.  Have him call after his surgery and we will: DC Brilinta. Start Plavix 75 mg Once daily  He will remain on ASA 81 mg Once daily.  Signed,  Tereso NewcomerScott Jorita Bohanon, PA-C   09/04/2014 8:29 AM

## 2014-09-04 NOTE — Progress Notes (Signed)
ADDENDUM:  1.  Echocardiogram 08/27/14 - Left ventricle: The cavity size was normal. Wall thickness was normal. Systolic function was normal. The estimated ejection fraction was in the range of 60% to 65%. Wall motion was normal; there were no regional wall motion abnormalities. Doppler parameters are consistent with abnormal left ventricular relaxation (grade 1 diastolic dysfunction). There was no evidence of elevated ventricular filling pressure by Doppler parameters.   - Aortic valve: Trileaflet; normal thickness leaflets. There was no regurgitation. - Aortic root: The aortic root was normal in size. - Mitral valve: There was no regurgitation. - Left atrium: The atrium was normal in size. - Right ventricle: Systolic function was normal. - Right atrium: The atrium was normal in size. - Tricuspid valve: There was mild regurgitation. - Pulmonic valve: There was no regurgitation. - Pulmonary arteries: Systolic pressure was within the normal range. - Inferior vena cava: The vessel was normal in size. - Pericardium, extracardiac: There was no pericardial effusion.  Impressions: - Abnormal relaxation with normal filling pressures.  Otherwise normal study.  2.  Lexiscan Myoview 08/21/14 Impression Exercise Capacity: Lexiscan with no exercise. BP Response: Normal blood pressure response. Clinical Symptoms: No significant symptoms noted. ECG Impression: No significant ECG changes with Lexiscan. Comparison with Prior Nuclear Study: No images to compare  Overall Impression: Low risk stress nuclear study with a small fixed apical defect, suggestive of previous infarction. No reversible ischemia is seen.  LV Ejection Fraction: 54%. LV Wall Motion: NL LV Function; NL Wall Motion    As noted above, the patient's echocardiogram demonstrates normal LV function. Stress testing is low risk and negative for ischemia. I reviewed the patient's case with Dr. Clifton JamesMcAlhany, his primary cardiologist. 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 Ideally, we would recommend continuing aspirin given his history of PCI. There is slightly increased risk of late stent thrombosis while off of all antiplatelet agents. However, if the risk of bleeding is too great, his aspirin should be resumed as soon as possible postoperatively. Given current clinical trial data, we will likely keep him on dual antiplatelet therapy for longer than one year. After his surgery, when it is felt to be safe, his Brilinta can be resumed. We will ultimately transition him from Brilinta to Plavix. Our office will contact the patient to arrange transition to Plavix.   He does not require any further cardiac testing prior to his noncardiac surgery. He is at acceptable risk. Our service is available in the perioperative period as necessary.  Signed, Tereso NewcomerScott Kyarah Enamorado, PA-C   09/04/2014 8:21 AM

## 2014-09-07 ENCOUNTER — Telehealth: Payer: Self-pay | Admitting: Cardiovascular Disease

## 2014-09-07 NOTE — Telephone Encounter (Signed)
Received request from Nurse fax box, documents faxed for surgical clearance. To: [facility] Fax number: [phone] Attention: [phone]/km

## 2014-09-07 NOTE — Telephone Encounter (Signed)
pt notified surg clearance form faxed over with instructions from [place] W. PA as for the brilinta for the surgery. Pt aware to call after surgery to discuss about d/c brilinta and start plavix. pt said verbalized understanding, said thank you

## 2014-09-08 ENCOUNTER — Other Ambulatory Visit: Payer: Self-pay

## 2014-09-08 DIAGNOSIS — I10 Essential (primary) hypertension: Secondary | ICD-10-CM

## 2014-09-08 MED ORDER — ATORVASTATIN CALCIUM 40 MG PO TABS: 40.0000 mg | ORAL_TABLET | Freq: Every day | ORAL | Status: DC

## 2014-09-08 MED ORDER — TICAGRELOR 90 MG PO TABS
90.0000 mg | ORAL_TABLET | Freq: Two times a day (BID) | ORAL | Status: DC
Start: 2014-09-08 — End: 2014-10-29

## 2014-09-08 MED ORDER — LOSARTAN POTASSIUM 100 MG PO TABS: 100.0000 mg | ORAL_TABLET | Freq: Every day | ORAL | Status: DC

## 2014-09-08 MED ORDER — BISOPROLOL FUMARATE 5 MG PO TABS: 5.0000 mg | ORAL_TABLET | Freq: Every day | ORAL | Status: DC

## 2014-09-17 ENCOUNTER — Telehealth: Payer: Self-pay | Admitting: Physician Assistant

## 2014-09-17 DIAGNOSIS — I10 Essential (primary) hypertension: Secondary | ICD-10-CM

## 2014-09-17 MED ORDER — LOSARTAN POTASSIUM 100 MG PO TABS: 100.0000 mg | ORAL_TABLET | Freq: Every day | ORAL | Status: DC

## 2014-09-17 NOTE — Telephone Encounter (Signed)
Called patient back. States he needs a script for Losartan 100mg / [facility]. Advised will send in to pharmacy. He will call ahead to make sure it is ready.

## 2014-09-17 NOTE — Telephone Encounter (Signed)
New Message   Pt requested to speak w/ Rn about medication (Losartan). Please call back and discuss.

## 2014-10-29 ENCOUNTER — Telehealth: Payer: Self-pay | Admitting: Cardiovascular Disease

## 2014-10-29 DIAGNOSIS — I10 Essential (primary) hypertension: Secondary | ICD-10-CM

## 2014-10-29 MED ORDER — LOSARTAN POTASSIUM 100 MG PO TABS: 100.0000 mg | ORAL_TABLET | Freq: Every day | ORAL | Status: AC

## 2014-10-29 MED ORDER — CLOPIDOGREL BISULFATE 75 MG PO TABS: 75.0000 mg | ORAL_TABLET | Freq: Every day | ORAL | Status: AC

## 2014-10-29 NOTE — Telephone Encounter (Signed)
Spoke with pt. He has seen surgeon and is no longer going to have back surgery. Pain to be medically managed. He is asking about cheaper alternative to Brilinta.  Chart reviewed and pt was to call after surgery and plan was to change him to Clopidogrel at that time.  I told pt he could stop Brilinta and start Clopidogrel 75 mg by mouth daily.  He has 5 days of Brilinta left and will finish these and then change to Clopidogrel. [facility] [address]

## 2014-10-29 NOTE — Telephone Encounter (Signed)
New message        Pt would like to know if there is cheaper medication that he can take   please give pt a call

## 2014-10-29 NOTE — Telephone Encounter (Signed)
Agree. cdm 

## 2014-10-29 NOTE — Telephone Encounter (Signed)
Follow up ° ° ° ° ° °Returned Pat's call °

## 2014-10-29 NOTE — Telephone Encounter (Signed)
I placed call to call back number and was told to call pt at [phone]. I placed call to this number and it is identified voicemail for pt.  I left message to call back

## 2014-12-21 ENCOUNTER — Other Ambulatory Visit: Payer: Self-pay | Admitting: Cardiovascular Disease

## 2015-03-24 ENCOUNTER — Other Ambulatory Visit: Payer: Self-pay

## 2015-03-24 MED ORDER — BISOPROLOL FUMARATE 5 MG PO TABS: 5.0000 mg | ORAL_TABLET | Freq: Every day | ORAL | Status: AC

## 2015-04-09 ENCOUNTER — Other Ambulatory Visit: Payer: Self-pay | Admitting: Cardiovascular Disease

## 2015-04-09 NOTE — Telephone Encounter (Signed)
Reordered with no refills.  Note included for pt to schedule appt for further refills.  Will ask scheduling to contact pt to schedule appt.

## 2015-04-09 NOTE — Telephone Encounter (Signed)
Please advise on refill. Per last office visit, Hyperlipidemia: This is managed by primary care. I will request his most recent lipid panel from his primary care physician. I do not see that we ever received these. Thanks, MI

## 2015-04-12 NOTE — Telephone Encounter (Signed)
Message has been left by scheduling for pt to contact office to schedule appt.

## 2015-07-31 ENCOUNTER — Other Ambulatory Visit: Payer: Self-pay | Admitting: Cardiovascular Disease

## 2015-09-06 ENCOUNTER — Other Ambulatory Visit: Payer: Self-pay | Admitting: Cardiovascular Disease

## 2016-02-14 IMAGING — CR DG ORBITS FOR FOREIGN BODY
2 series · 2 of 2 positions shown · non-contrast
Comparison: None.

CLINICAL DATA: Metal working/exposure; clearance prior to MRI

EXAM:
ORBITS FOR FOREIGN BODY - 2 VIEW

[view not recorded (1 of 2)]
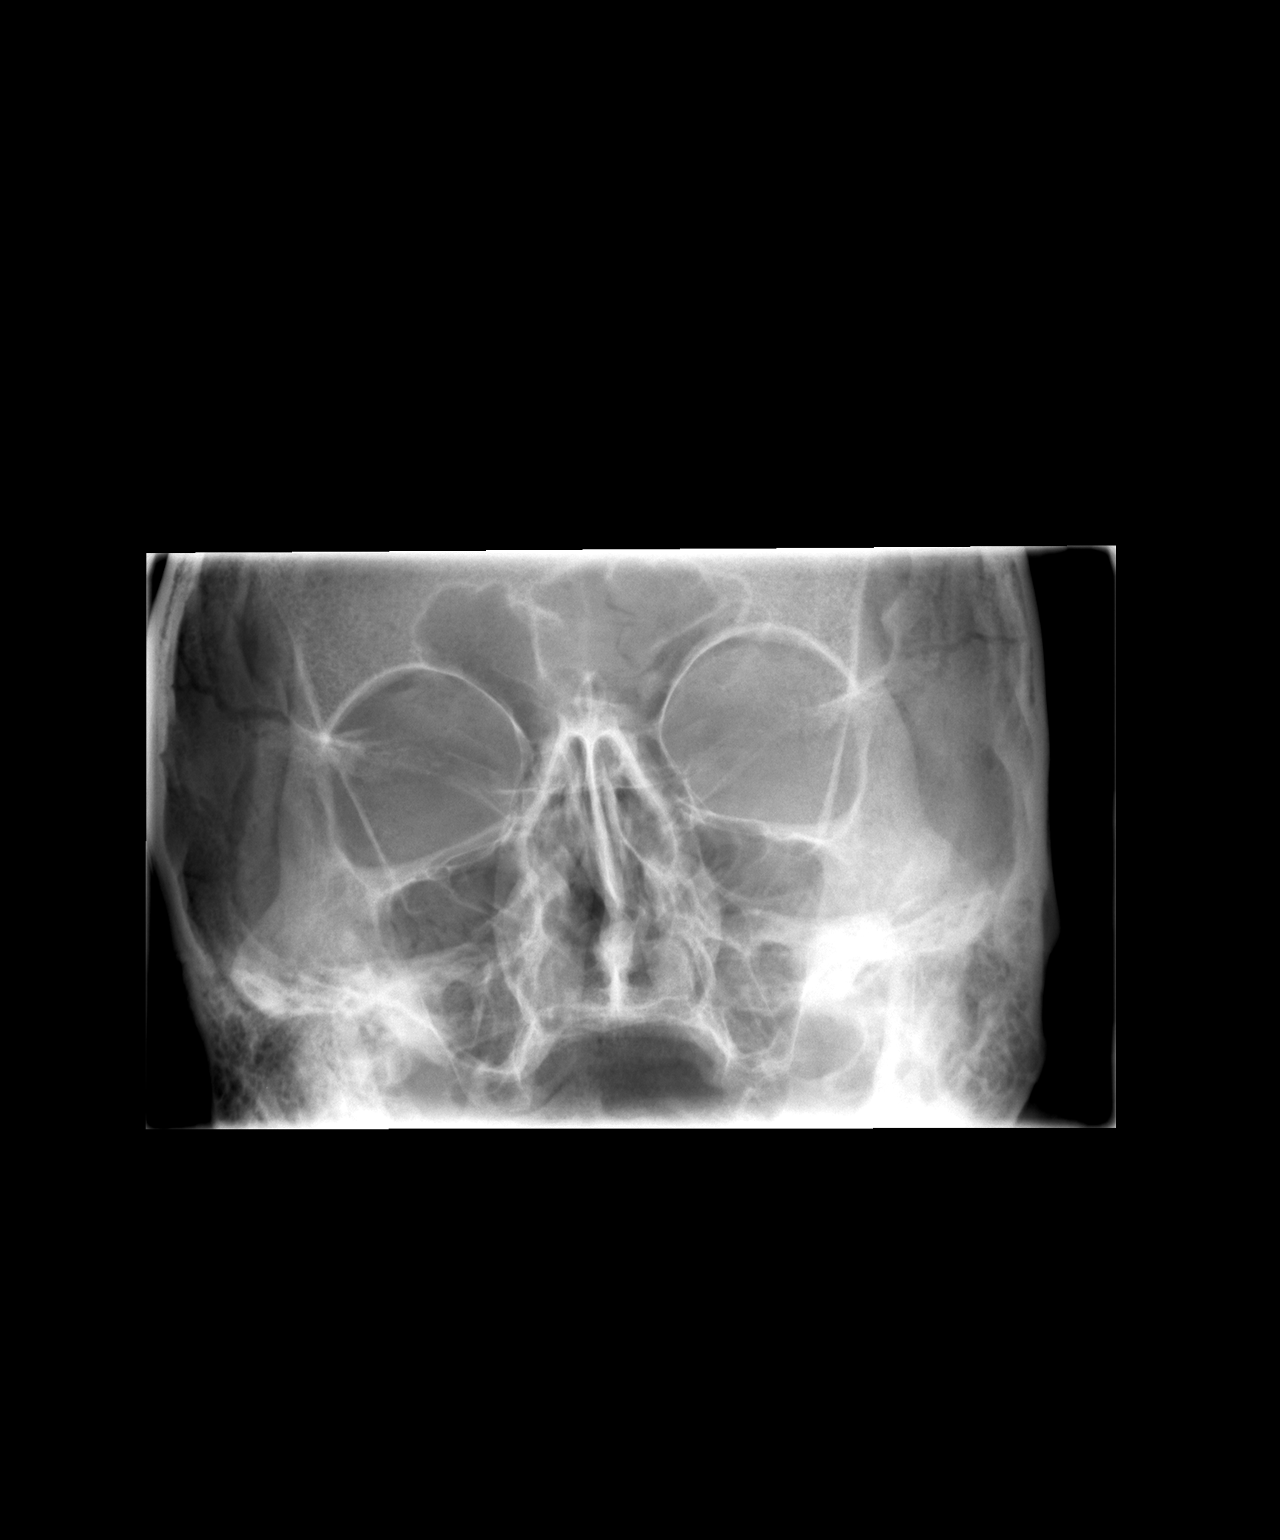

[view not recorded (2 of 2)]
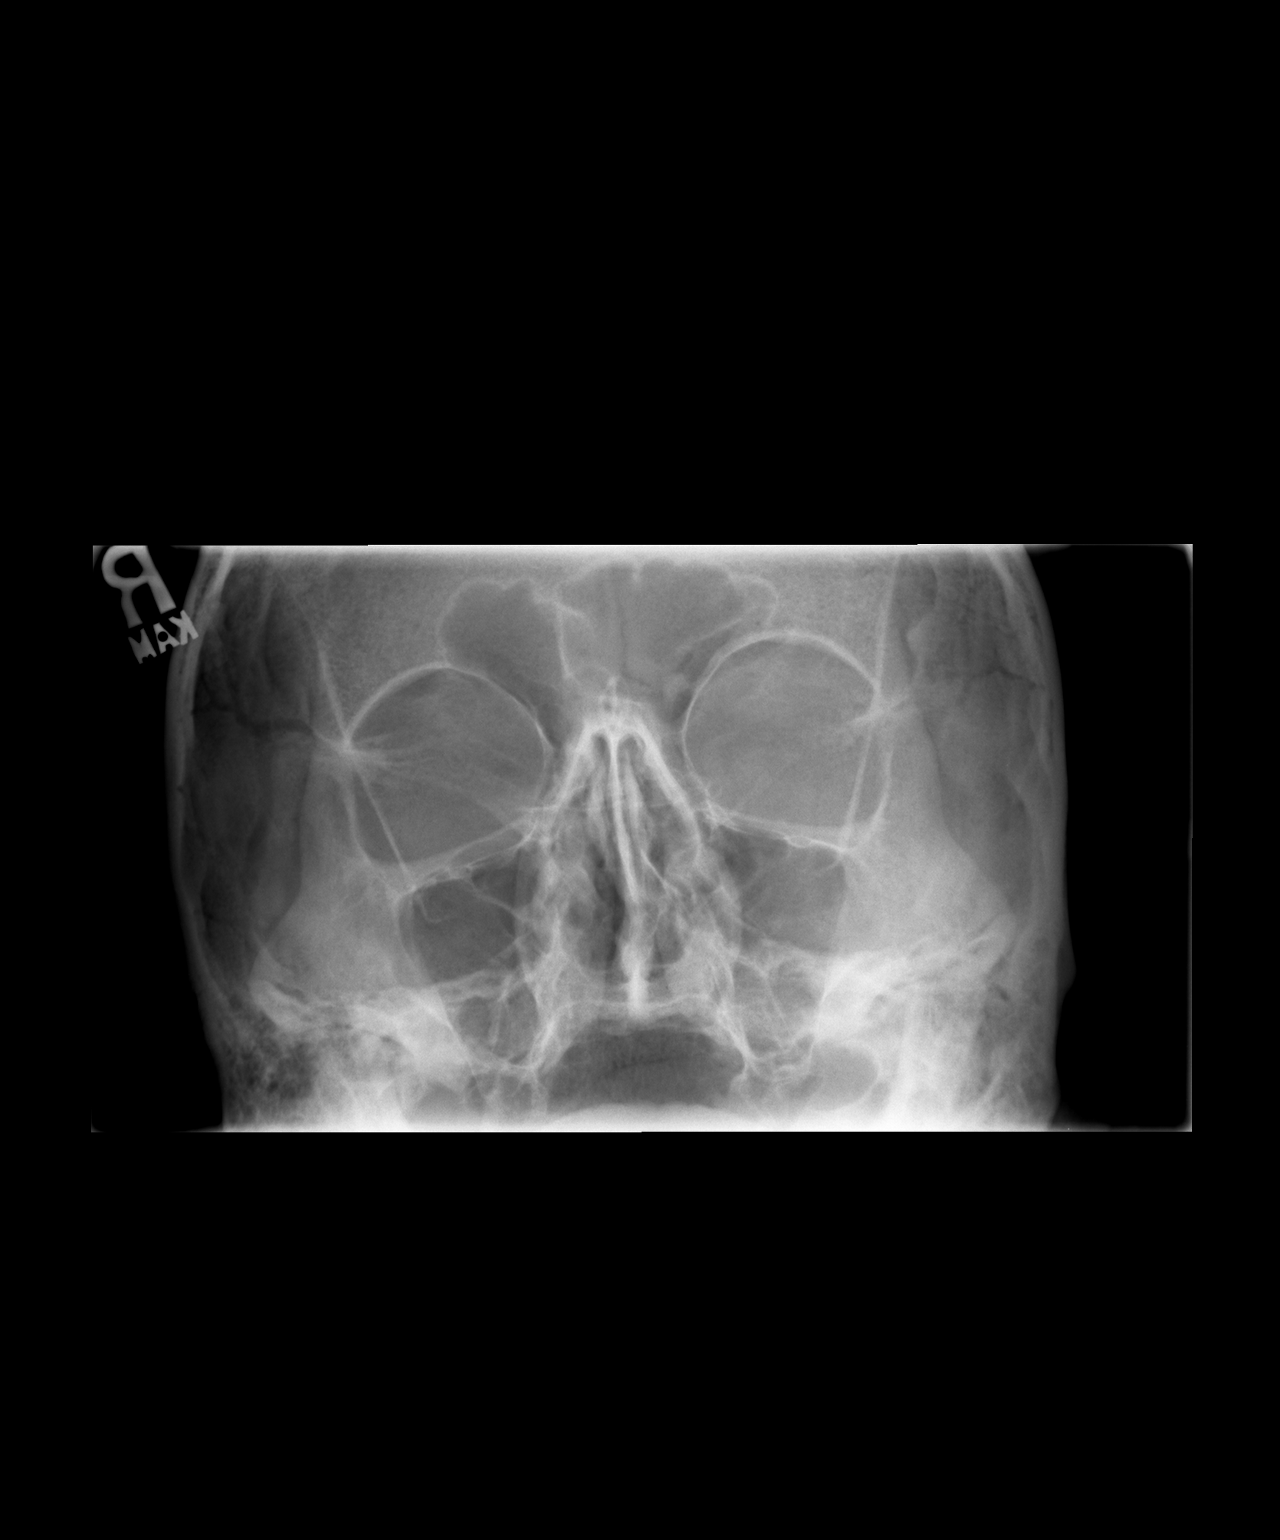

[2 of 2 positions shown; findings below may reference images not displayed]

FINDINGS: There is no evidence of metallic foreign body within the orbits. No
significant bone abnormality identified.
IMPRESSION: No evidence of metallic foreign body within the orbits.

## 2019-02-15 DEATH — deceased
